# Patient Record
Sex: Male | Born: 1955 | Race: White | Hispanic: No | Marital: Married | Smoking: Never smoker
Health system: Southern US, Community
[De-identification: ages and names within clinical notes are randomized; demographics above are authoritative.]

## PROBLEM LIST (undated history)

## (undated) DIAGNOSIS — E785 Hyperlipidemia, unspecified: Secondary | ICD-10-CM

## (undated) DIAGNOSIS — T8859XA Other complications of anesthesia, initial encounter: Secondary | ICD-10-CM

## (undated) DIAGNOSIS — K760 Fatty (change of) liver, not elsewhere classified: Secondary | ICD-10-CM

## (undated) DIAGNOSIS — J45909 Unspecified asthma, uncomplicated: Secondary | ICD-10-CM

## (undated) DIAGNOSIS — I10 Essential (primary) hypertension: Secondary | ICD-10-CM

## (undated) DIAGNOSIS — G4730 Sleep apnea, unspecified: Secondary | ICD-10-CM

## (undated) DIAGNOSIS — F419 Anxiety disorder, unspecified: Secondary | ICD-10-CM

## (undated) DIAGNOSIS — K219 Gastro-esophageal reflux disease without esophagitis: Secondary | ICD-10-CM

## (undated) DIAGNOSIS — F329 Major depressive disorder, single episode, unspecified: Secondary | ICD-10-CM

## (undated) DIAGNOSIS — K2270 Barrett's esophagus without dysplasia: Secondary | ICD-10-CM

## (undated) DIAGNOSIS — T4145XA Adverse effect of unspecified anesthetic, initial encounter: Secondary | ICD-10-CM

## (undated) DIAGNOSIS — R112 Nausea with vomiting, unspecified: Secondary | ICD-10-CM

## (undated) DIAGNOSIS — F32A Depression, unspecified: Secondary | ICD-10-CM

## (undated) DIAGNOSIS — R059 Cough, unspecified: Secondary | ICD-10-CM

## (undated) DIAGNOSIS — G5600 Carpal tunnel syndrome, unspecified upper limb: Secondary | ICD-10-CM

## (undated) DIAGNOSIS — J189 Pneumonia, unspecified organism: Secondary | ICD-10-CM

## (undated) DIAGNOSIS — E119 Type 2 diabetes mellitus without complications: Secondary | ICD-10-CM

## (undated) DIAGNOSIS — N200 Calculus of kidney: Secondary | ICD-10-CM

## (undated) DIAGNOSIS — K589 Irritable bowel syndrome without diarrhea: Secondary | ICD-10-CM

## (undated) DIAGNOSIS — R05 Cough: Secondary | ICD-10-CM

## (undated) DIAGNOSIS — Z9889 Other specified postprocedural states: Secondary | ICD-10-CM

## (undated) DIAGNOSIS — J410 Simple chronic bronchitis: Secondary | ICD-10-CM

## (undated) DIAGNOSIS — M1990 Unspecified osteoarthritis, unspecified site: Secondary | ICD-10-CM

## (undated) HISTORY — DX: Carpal tunnel syndrome, unspecified upper limb: G56.00

## (undated) HISTORY — PX: CYSTOSCOPY W/ STONE MANIPULATION: SHX1427

## (undated) HISTORY — DX: Depression, unspecified: F32.A

## (undated) HISTORY — DX: Gastro-esophageal reflux disease without esophagitis: K21.9

## (undated) HISTORY — DX: Type 2 diabetes mellitus without complications: E11.9

## (undated) HISTORY — DX: Fatty (change of) liver, not elsewhere classified: K76.0

## (undated) HISTORY — PX: KNEE ARTHROSCOPY: SUR90

## (undated) HISTORY — DX: Unspecified asthma, uncomplicated: J45.909

## (undated) HISTORY — DX: Barrett's esophagus without dysplasia: K22.70

## (undated) HISTORY — PX: UPPER GASTROINTESTINAL ENDOSCOPY: SHX188

## (undated) HISTORY — PX: KNEE ARTHROSCOPY: SHX127

## (undated) HISTORY — PX: CARPAL TUNNEL RELEASE: SHX101

## (undated) HISTORY — DX: Irritable bowel syndrome, unspecified: K58.9

## (undated) HISTORY — DX: Hyperlipidemia, unspecified: E78.5

## (undated) HISTORY — PX: CHOLECYSTECTOMY: SHX55

## (undated) HISTORY — DX: Anxiety disorder, unspecified: F41.9

## (undated) HISTORY — DX: Essential (primary) hypertension: I10

---
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
## 2005-04-27 HISTORY — PX: ESOPHAGOGASTRODUODENOSCOPY: SHX1529

## 2010-05-18 HISTORY — PX: SHOULDER SURGERY: SHX246

## 2010-08-17 HISTORY — PX: JOINT REPLACEMENT: SHX530

## 2010-08-19 ENCOUNTER — Ambulatory Visit (HOSPITAL_COMMUNITY)
Admission: RE | Admit: 2010-08-19 | Discharge: 2010-08-19 | Disposition: A | Discharge status: Home / Self Care | Payer: 59 | Source: Ambulatory Visit | Attending: Orthopedic Surgery | Admitting: Orthopedic Surgery

## 2010-08-19 ENCOUNTER — Other Ambulatory Visit (HOSPITAL_COMMUNITY): Payer: Self-pay | Admitting: Orthopedic Surgery

## 2010-08-19 ENCOUNTER — Encounter (HOSPITAL_COMMUNITY)
Admission: RE | Admit: 2010-08-19 | Discharge: 2010-08-19 | Disposition: A | Discharge status: Home / Self Care | Payer: 59 | Source: Ambulatory Visit | Attending: Orthopedic Surgery | Admitting: Orthopedic Surgery

## 2010-08-19 DIAGNOSIS — IMO0002 Reserved for concepts with insufficient information to code with codable children: Secondary | ICD-10-CM | POA: Insufficient documentation

## 2010-08-19 DIAGNOSIS — M1710 Unilateral primary osteoarthritis, unspecified knee: Secondary | ICD-10-CM

## 2010-08-19 DIAGNOSIS — Z01812 Encounter for preprocedural laboratory examination: Secondary | ICD-10-CM | POA: Insufficient documentation

## 2010-08-19 DIAGNOSIS — Z01810 Encounter for preprocedural cardiovascular examination: Secondary | ICD-10-CM | POA: Insufficient documentation

## 2010-08-19 DIAGNOSIS — Z01818 Encounter for other preprocedural examination: Secondary | ICD-10-CM | POA: Insufficient documentation

## 2010-08-19 LAB — BASIC METABOLIC PANEL
CO2: 26 mEq/L (ref 19–32)
Chloride: 105 mEq/L (ref 96–112)
Creatinine, Ser: 0.97 mg/dL (ref 0.4–1.5)
GFR calc Af Amer: >60 mL/min (ref >60)
Potassium: 4.2 mEq/L (ref 3.5–5.1)
Sodium: 139 mEq/L (ref 135–145)

## 2010-08-19 LAB — CBC
HCT: 45.9 % (ref 39.0–52.0)
MCH: 29.9 pg (ref 26.0–34.0)
MCV: 85.8 fL (ref 78.0–100.0)
Platelets: 245 10*3/uL (ref 150–400)
RBC: 5.35 MIL/uL (ref 4.22–5.81)
RDW: 13.1 % (ref 11.5–15.5)

## 2010-08-19 LAB — DIFFERENTIAL
Eosinophils Absolute: 0.1 10*3/uL (ref 0.0–0.7)
Eosinophils Relative: 1 % (ref 0–5)
Lymphocytes Relative: 26 % (ref 12–46)
Lymphs Abs: 2.3 10*3/uL (ref 0.7–4.0)
Monocytes Relative: 8 % (ref 3–12)

## 2010-08-19 LAB — SURGICAL PCR SCREEN
MRSA, PCR: NEGATIVE
Staphylococcus aureus: NEGATIVE

## 2010-08-19 LAB — TYPE AND SCREEN
ABO/RH(D): A POS
Antibody Screen: NEGATIVE

## 2010-08-19 LAB — URINALYSIS, ROUTINE W REFLEX MICROSCOPIC
Glucose, UA: NEGATIVE mg/dL
Hgb urine dipstick: NEGATIVE
Protein, ur: NEGATIVE mg/dL
pH: 5.5 (ref 5.0–8.0)

## 2010-08-19 LAB — APTT: aPTT: 29 seconds (ref 24–37)

## 2010-08-19 LAB — ABO/RH: ABO/RH(D): A POS

## 2010-08-25 ENCOUNTER — Inpatient Hospital Stay (HOSPITAL_COMMUNITY)
Admission: RE | Admit: 2010-08-25 | Discharge: 2010-08-29 | DRG: 462 | Disposition: A | Discharge status: Skilled Nursing Facility | Payer: 59 | Source: Ambulatory Visit | Attending: Orthopedic Surgery | Admitting: Orthopedic Surgery

## 2010-08-25 DIAGNOSIS — Z01818 Encounter for other preprocedural examination: Secondary | ICD-10-CM

## 2010-08-25 DIAGNOSIS — Z01812 Encounter for preprocedural laboratory examination: Secondary | ICD-10-CM

## 2010-08-25 DIAGNOSIS — I951 Orthostatic hypotension: Secondary | ICD-10-CM | POA: Diagnosis not present

## 2010-08-25 DIAGNOSIS — M1710 Unilateral primary osteoarthritis, unspecified knee: Principal | ICD-10-CM | POA: Diagnosis present

## 2010-08-25 DIAGNOSIS — Z87442 Personal history of urinary calculi: Secondary | ICD-10-CM

## 2010-08-25 DIAGNOSIS — Z01810 Encounter for preprocedural cardiovascular examination: Secondary | ICD-10-CM

## 2010-08-25 DIAGNOSIS — G4733 Obstructive sleep apnea (adult) (pediatric): Secondary | ICD-10-CM | POA: Diagnosis present

## 2010-08-25 DIAGNOSIS — R110 Nausea: Secondary | ICD-10-CM | POA: Diagnosis not present

## 2010-08-26 LAB — BASIC METABOLIC PANEL
CO2: 30 mEq/L (ref 19–32)
Calcium: 8.2 mg/dL — ABNORMAL LOW (ref 8.4–10.5)
Chloride: 101 mEq/L (ref 96–112)
Creatinine, Ser: 1.23 mg/dL (ref 0.4–1.5)
Glucose, Bld: 175 mg/dL — ABNORMAL HIGH (ref 70–99)

## 2010-08-26 LAB — CBC
HCT: 33.8 % — ABNORMAL LOW (ref 39.0–52.0)
MCH: 29.4 pg (ref 26.0–34.0)
MCHC: 33.4 g/dL (ref 30.0–36.0)
MCV: 87.8 fL (ref 78.0–100.0)
Platelets: 199 10*3/uL (ref 150–400)
RDW: 13.3 % (ref 11.5–15.5)

## 2010-08-26 NOTE — Op Note (Signed)
NAME:  Shawn Fox, LATA NO.:  [id number]  MEDICAL RECORD NO.:  [id number]           PATIENT TYPE:  I  LOCATION:  5032                         FACILITY:  [facility]  PHYSICIAN:  Feliberto Gottron. Turner Daniels, M.D.   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  PROCEDURE:  Right knee arthroscopic evaluation to document bare bone arthritic changes to the trochlea and distal aspect of the medial femoral condyle.  This was followed by left total knee arthroplasty using DePuy Sigma RP components, 5 femur, 5 tibia, 41-mm patellar button, 10-mm Sigma RP bearing.  All components cemented with a double batch of DePuy HV cement with 1500 mg of Zinacef.  We then proceeded to right total knee arthroplasty using DePuy Sigma RP components, 5 femur, 4 tibia, 10-mm Sigma RP bearing and a 38-mm patellar button double batch of DePuy HV cement with 1500 mg of Zinacef.  SURGEON:  Feliberto Gottron. Turner Daniels, MD  FIRST ASSISTANT:  Shirl Harris, PA-C  ANESTHETIC:  General endotracheal.  ESTIMATED BLOOD LOSS:  400 mL on the left.  FLUID REPLACEMENT:  2 liters of crystalloid.  DRAINS PLACED:  Two medium Hemovacs on each knee and a Foley catheter.  URINE OUTPUT:  About 400 mL.  INDICATIONS FOR PROCEDURE:  A 55 year old gentleman with end-stage arthritis of the left knee bone-on-bone who was failed conservative measures, anti-inflammatory medicines, physical therapy, cortisone injections and Viscosupplementation.  On the right, he has moderate arthritic changes, but he was constrained that has bare bone arthritis and pain in his right knee was actually worse that the left now.  He desires elective arthroscopic evaluation and treatment of the right knee if possible, but it there  was bare bone, he is desire to proceed with right total knee arthroplasty.  On the left side, the plan has always been left total knee arthroplasty.  Risks and benefits of procedure and options have been discussed at length and he is prepared for surgical intervention.  DESCRIPTION OF PROCEDURE:  The patient was identified by armband and received a preoperative IV antibiotics in the holding area at [facility] followed by an epidural catheter, taken to operating room #4, appropriate anesthetic monitors were attached, general endotracheal anesthesia was induced with the patient in supine position.  Foley catheter was inserted.  Tourniquet was applied to the high thigh. Bilateral foot positioners and lateral post applied.  The patient was prepped and draped in usual sterile fashion from the ankles to the midthighs.  Time-out procedure was performed.  At this point, we began the procedure by making peripatellar portals in the right knee inferomedial and inferolateral and inserted the arthroscope into the inferolateral portal and the outflow through the inferomedial portal. Diagnostic arthroscopy revealed grade 3 chondromalacia, apex of the patella, focal grade 4 of the lateral facet and in the trochlea grade 3 to grade 4 chondromalacia as well and more importantly along the anterior distal aspect  of the medial femoral condyle, again grade 3 and focal grade 4 chondromalacia of the menisci were intact.  A little bit of meniscal tearing to the lateral meniscus, which was lightly debrided. Because there was bare bone found at this arthroscopy, we went ahead and removed the arthroscopic instruments and proceeded to left total knee arthroplasty.  Left lower extremity was then wrapped with an Esmarch bandage.  Tourniquet was inflated to 350 mmHg and anterior midline incision was made from a handbreadth above the patella to a handbreadth below the patella through the skin and subcutaneous  tissue to where 1 cm medial to and 3 cm distal to the tibial tubercle.  Small bleeders were identified and cauterized.  Transverse retinaculum was incised allowing a medial parapatellar arthrotomy.  Patella was everted.  Prepatellar fat pad resected.  Superficial medial collateral ligament elevated from anterior to posterior along the proximal aspect of the tibia.  The knee was then hyperflexed exposing the arthritic knee joint, which was down to bare bone medially with very large medial osteophytes, which were removed at this time.  The tibial spines removed with an osteotome.  A McCullough retractor was placed through the notch and the lateral Hohmann retractor.  We then entered the proximal tibia in line with the axis of the tibia with a step drill followed by the intramedullary rod and 2-degree posterior slope cutting guide.  This was set to remove 5 mm of bone medially and about 9-10 mm of bone laterally.  The cutting guide was pinned into position.  The IM rod removed and the proximal tibial cut accomplished without difficulty followed by removal of more peripheral osteophytes.  We then ended to distal femur 2 mm anterior to the PCL origin with a step drill followed by the IM rod, 5-degree left distal femoral cutting guide set for 12-mm cut because of the relatively large size of the femur.  This was pinned along the epicondylar axis and the distal femoral cut accomplished.  We then sized for a #5 femoral component using the posterior referencing sizing guide and set the cutting guide to 3 degrees of external rotation because of the varus deformity.  The chamfer cutting guide was then pinned into place.  The anterior, posterior and chamfer cuts were accomplished without difficulty.  More peripheral osteophytes were removed.  The box cutting guide was then pinned into place and the box cut was made.  The knee was then brought into full extension.  We removed the posterior half of  the menisci.  The knee was once again hyperflexed allowing [place] to remove some posterior-superior osteophytes at this time.  The patella sized to 24 mm, failed to fit a 31 button.  The cutting guide was set at 15 and the posterior 9-10 mm, the patella resected, sized to a 41 button and the lollipop was drilled at this point.  With the knee was hyperflexed, we then sized for a #5 tibial component.  The trial was pinned into place followed by the smokestack and the conical reamer followed by the Delta fin keel punch.  A 5 left femoral component was then hammered into place and the lugs drilled.  We inserted 10-mm Sigma RP bearing, brought the knee to full extension and flexed to 130 degrees without difficulty.  No thumb pressure was required on the patella, which did not subluxate laterally.  At this point, the trial components were removed, all bony surfaces water picked, cleaned dried with suction and sponges, double batch of  DePuy HV cement was then mixed and applied to all bony and metallic mating surfaces except for the posterior condyles of the femur itself.  In order, we then hammered into place a 5 tibial baseplate and removed excess cement, 5 left femoral component, removed excess cement, inserted a 10-mm Sigma RP bearing and brought the knee to full extension and then flexed to 45 degrees and removed more cement that was squeezed with the extension-compression maneuver.  The 41 button was then clamped into place and excess cement removed.  Wound irrigated out thoroughly with normal saline pulse lavage solution.  Medium Hemovac drains placed from an anterolateral approach.  After the cement had hardened, we checked our stability and range of motion one more time, they were excellent.  Parapatellar arthrotomy was closed with running #1 Vicryl suture, the subcutaneous tissue with 0 and 2-0 undyed Vicryl suture and the skin with skin staples.  A dressing of Xeroform, 4x4  dressing, sponges, Webril and an Ace wrap was then applied.  Tourniquet was let down.  The toe was noted to pink up and we proceeded to the right knee. This was likewise wrapped with an Esmarch bandage, tourniquet was inflated to 350 mmHg.  Similar incision approach was made on the right side.  After preparing the bony surfaces of femur and the tibia, the femur sized to a #5 right component, the tibia to a #4 tibial component and the Sigma RP bearing was a 10-mm bearing and the button was a 38 button.  After the trials were inserted, stability was also noted to be excellent.  The trial was removed.  The wound was irrigated out thoroughly with normal saline solution.  Bony surfaces were water picked, cleaned, dried with suction and sponges, double batch of DePuy HV cement was mixed with 1500 mg of Zinacef applied to all bony and metallic mating surfaces.  In order, we cemented into place a 4 tibia, 5 right femur, removed the excess cement to both, and inserted a 10-mm bearing and after bringing the knee to full extension with compression, flexed to 45, and removed more cement.  The 38 button was then clamped into place.  Excess cement removed.  After the cement had cured, we checked our stability one more time and again was excellent.  Medium Hemovacs were inserted from an anterolateral approach.  Parapatellar arthrotomy was closed with running #1 Vicryl suture, the subcutaneous tissue with 0 and 2-0 undyed Vicryl suture and the skin with skin staples.  Dressing of Xeroform, 4x4 dressing, sponges, Webril and an Ace wrap applied on the right side.  Tourniquet was let down.  The patient was awakened, extubated, and taken to the recovery room without difficulty.     Feliberto Gottron. Turner Daniels, M.D.     Ovid Curd  D:  08/25/2010  T:  08/26/2010  Job:  [id number]  Electronically Signed by Gean Birchwood M.D. on 08/26/2010 03:51:08 PM

## 2010-08-27 LAB — PROTIME-INR: Prothrombin Time: 20.4 seconds — ABNORMAL HIGH (ref 11.6–15.2)

## 2010-08-27 LAB — CBC
MCH: 29.2 pg (ref 26.0–34.0)
MCHC: 33.2 g/dL (ref 30.0–36.0)
MCV: 87.9 fL (ref 78.0–100.0)
Platelets: 176 10*3/uL (ref 150–400)
RBC: 3.56 MIL/uL — ABNORMAL LOW (ref 4.22–5.81)

## 2010-08-28 LAB — CBC
Hemoglobin: 9.7 g/dL — ABNORMAL LOW (ref 13.0–17.0)
MCH: 29.4 pg (ref 26.0–34.0)
MCHC: 34 g/dL (ref 30.0–36.0)
MCV: 86.4 fL (ref 78.0–100.0)
RBC: 3.3 MIL/uL — ABNORMAL LOW (ref 4.22–5.81)

## 2010-08-28 LAB — PROTIME-INR: Prothrombin Time: 26.9 seconds — ABNORMAL HIGH (ref 11.6–15.2)

## 2010-08-29 LAB — PROTIME-INR: INR: 1.87 — ABNORMAL HIGH (ref 0.00–1.49)

## 2010-09-05 NOTE — Discharge Summary (Signed)
NAME:  Shawn Fox, Shawn Fox NO.:  [id number]  MEDICAL RECORD NO.:  [id number]           PATIENT TYPE:  I  LOCATION:  5032                         FACILITY:  [facility]  PHYSICIAN:  Feliberto Gottron. Turner Daniels, M.D.   DATE OF BIRTH:  09-28-1955  DATE OF ADMISSION:  08/25/2010 DATE OF DISCHARGE:  08/28/2010                              DISCHARGE SUMMARY   CHIEF COMPLAINT:  Bilateral knee pain.  HISTORY OF PRESENT ILLNESS:  This is a 55 year old gentleman who complains of severe unremitting pain in both of his knees despite extensive conservative treatment with antiinflammatories, pain medication, and steroid injections in addition to arthroscopic decompression.  He now desires a surgical intervention.  All risks and benefits of surgery were discussed with the patient.  PAST MEDICAL HISTORY:  Significant for sleep apnea, kidney stones, and arthritis.  PAST SURGICAL HISTORY:  Significant for knee arthroscopy.  ALLERGIES:  He has an allergy to DEMEROL.  FAMILY HISTORY:  Noncontributory.  SOCIAL HISTORY:  He denies use of tobacco or alcohol.  PHYSICAL EXAMINATION:  Gross examination of the knee demonstrates range of motion to be 5-120 degrees.  He has a varus deformity on the left side.  He is tender to palpation along the medial joint lines was palpable patellofemoral crepitus.  X-rays of the knee demonstrates end- stage arthritis in the medial compartment of the left knee.  The right knee had moderate-to-severe arthritis in the medial compartment. Arthroscopic evaluation of the right knee demonstrates more extensive arthritic changes in the patellofemoral compartment.  PREOP LABS:  White blood cells 8.7, red blood cells 5.35, hemoglobin 16, hematocrit 45.9, platelets 245.  PT 11.9, INR 0.86, PTT 29.  Sodium 139, potassium 4.2, chloride 105, glucose 97, BUN 12.  Urinalysis was within normal limits.  HOSPITAL COURSE:  Mr. Dusza was admitted to [facility] on August 25, 2010 when he  underwent bilateral total knee arthroplasties.  The procedures were performed by Dr. Gean Birchwood and the patient tolerated them well. Hemovac drains were placed into both the right and left knee.  A perioperative Foley catheter was also placed.  He was transferred to the floor on Lovenox and Coumadin for DVT prophylaxis.  He had epidural anesthesia for pain control.  The following morning, he reported that he was doing well.  He complained of nausea, but no vomiting.  His Foley catheter was removed after physical therapy.  Both of his surgical drains were removed by Dr. Turner Daniels.  On the second postoperative day, he continued to report some mild nausea, but stated that his pain was fairly well controlled.  Hemoglobin was 10.4.  Surgical dressings were changed and both incisions were found to be benign.  He made slow progress with physical therapy.  On the third postoperative day, he was awake and alert and passing stool and flatus.  His hemoglobin was 9.7 and his surgical dressing remained clean.  He was discharged to a skilled nursing facility for rehab.  DISPOSITION:  The patient was discharged to skilled nursing on August 28, 2010.  He is weightbearing as tolerated.  He would be maintained on Coumadin with a target  INR of 1.5 to 2.0.  This will be managed by the nursing facility. He would also be managing his physical therapy.  He will return to the clinic in approximately 10 days for x-rays and staple removal.  FINAL DIAGNOSIS:  End-stage arthritis of both knees.     Shirl Harris, PA   ______________________________ Feliberto Gottron. Turner Daniels, M.D.    JW/MEDQ  D:  08/28/2010  T:  08/28/2010  Job:  [id number]  Electronically Signed by Shirl Harris PA on 09/04/2010 05:21:10 PM Electronically Signed by Gean Birchwood M.D. on 09/05/2010 09:53:55 AM

## 2010-09-05 NOTE — Discharge Summary (Signed)
  NAME:  BRAYDYN, SCHULTES NO.:  [id number]  MEDICAL RECORD NO.:  [id number]           PATIENT TYPE:  I  LOCATION:  5032                         FACILITY:  [facility]  PHYSICIAN:  Feliberto Gottron. Turner Daniels, M.D.   DATE OF BIRTH:  09/28/1955  DATE OF ADMISSION:  08/25/2010 DATE OF DISCHARGE:  08/29/2010                              DISCHARGE SUMMARY   ADDENDUM  Mr. Kloth developed some orthostatic hypotension with physial therapy yesterday when he was kept an additional night for observation.  On August 29, 2010, he is reporting significant improvement and felt ready for rehab, so he was discharge for [facility] for short stay in the skilled nursing facility.     Shirl Harris, PA   ______________________________ Feliberto Gottron. Turner Daniels, M.D.    JW/MEDQ  D:  08/29/2010  T:  08/29/2010  Job:  [id number]  Electronically Signed by Shirl Harris PA on 09/04/2010 05:21:49 PM Electronically Signed by Gean Birchwood M.D. on 09/05/2010 09:53:57 AM

## 2011-04-23 ENCOUNTER — Other Ambulatory Visit: Payer: Self-pay | Admitting: Orthopedic Surgery

## 2011-04-23 NOTE — H&P (Signed)
  HISTORY OF PRESENT ILLNESS:  Shawn Fox has been seen previously for left shoulder impingement and AC joint arthritis.  He has had a couple cortisone injections that have each provided him with temporary pain relief.  At this point the left shoulder wakes him from sleep most nights, and he is interested in discussing a surgical intervention.  He also has a new complaint of pain and a nodule at the base of his right 4th finger.  He reports that it has been present for about a year or 2, but it has recently gotten larger, and any direct pressure causes fairly significant pain. He is right-hand dominant.  ROS: Patient denies dizziness, nausea, fever, chills, vomiting, shortness of breath, chest pain, loss of appetite, or rash.    OBJECTIVE:  Left Shoulder:  Demonstrates 1+ positive impingement in the Neer position and 2+ positive impingement in the Hawkins position.  He has pain with cross-arm adduction testing and is tender to palpation at the [facility] joint.  Isolated testing of the rotator cuff demonstrates strength to be 5/5 throughout.  Examination of the right hand demonstrates a palpable nodule at the 4th MCP joint that is tender to palpation.  RADIOGRAPHS:  X-rays were ordered, performed, and interpreted by me today.  Two views of the right ring finger demonstrate no obvious fracture or degenerative changes.  IMPRESSION:   1.    Status post bilateral total knee arthroplasties dated 08/25/2010. 2.    Right 4th finger A2 ganglion cyst. 3.    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  PLAN: In regard to the left shoulder and the hand, we will get him set up for a left shoulder decompression and excision of a right hand ganglion cyst.

## 2011-04-24 ENCOUNTER — Encounter (HOSPITAL_BASED_OUTPATIENT_CLINIC_OR_DEPARTMENT_OTHER): Payer: Self-pay | Admitting: *Deleted

## 2011-04-24 NOTE — Progress Notes (Signed)
Since pt used to have sleep apnea-lost wt-does not have-recommended bringing overnight bag-plan to go home-brother to stay with him post op-depends on how his resp status is. Did well post bilat total knees 4/12

## 2011-04-26 ENCOUNTER — Encounter (HOSPITAL_BASED_OUTPATIENT_CLINIC_OR_DEPARTMENT_OTHER): Payer: Self-pay | Admitting: Orthopedic Surgery

## 2011-04-26 DIAGNOSIS — M7542 Impingement syndrome of left shoulder: Secondary | ICD-10-CM | POA: Diagnosis present

## 2011-04-26 DIAGNOSIS — M67441 Ganglion, right hand: Secondary | ICD-10-CM

## 2011-04-27 ENCOUNTER — Encounter (HOSPITAL_BASED_OUTPATIENT_CLINIC_OR_DEPARTMENT_OTHER)
Admission: RE | Disposition: A | Discharge status: Home / Self Care | Payer: Self-pay | Source: Ambulatory Visit | Attending: Orthopedic Surgery

## 2011-04-27 ENCOUNTER — Encounter (HOSPITAL_BASED_OUTPATIENT_CLINIC_OR_DEPARTMENT_OTHER): Payer: Self-pay | Admitting: *Deleted

## 2011-04-27 ENCOUNTER — Encounter (HOSPITAL_BASED_OUTPATIENT_CLINIC_OR_DEPARTMENT_OTHER): Payer: Self-pay | Admitting: Anesthesiology

## 2011-04-27 ENCOUNTER — Ambulatory Visit (HOSPITAL_BASED_OUTPATIENT_CLINIC_OR_DEPARTMENT_OTHER): Payer: 59 | Admitting: Anesthesiology

## 2011-04-27 ENCOUNTER — Ambulatory Visit (HOSPITAL_BASED_OUTPATIENT_CLINIC_OR_DEPARTMENT_OTHER)
Admission: RE | Admit: 2011-04-27 | Discharge: 2011-04-27 | Disposition: A | Discharge status: Home / Self Care | Payer: 59 | Source: Ambulatory Visit | Attending: Orthopedic Surgery | Admitting: Orthopedic Surgery

## 2011-04-27 DIAGNOSIS — M6740 Ganglion, unspecified site: Secondary | ICD-10-CM | POA: Insufficient documentation

## 2011-04-27 DIAGNOSIS — M67441 Ganglion, right hand: Secondary | ICD-10-CM

## 2011-04-27 DIAGNOSIS — M7542 Impingement syndrome of left shoulder: Secondary | ICD-10-CM | POA: Diagnosis present

## 2011-04-27 DIAGNOSIS — G4730 Sleep apnea, unspecified: Secondary | ICD-10-CM | POA: Insufficient documentation

## 2011-04-27 DIAGNOSIS — M25819 Other specified joint disorders, unspecified shoulder: Secondary | ICD-10-CM | POA: Insufficient documentation

## 2011-04-27 DIAGNOSIS — M19019 Primary osteoarthritis, unspecified shoulder: Secondary | ICD-10-CM | POA: Insufficient documentation

## 2011-04-27 HISTORY — DX: Pneumonia, unspecified organism: J18.9

## 2011-04-27 HISTORY — DX: Calculus of kidney: N20.0

## 2011-04-27 HISTORY — DX: Simple chronic bronchitis: J41.0

## 2011-04-27 HISTORY — DX: Adverse effect of unspecified anesthetic, initial encounter: T41.45XA

## 2011-04-27 HISTORY — DX: Unspecified osteoarthritis, unspecified site: M19.90

## 2011-04-27 HISTORY — PX: MASS EXCISION: SHX2000

## 2011-04-27 HISTORY — DX: Sleep apnea, unspecified: G47.30

## 2011-04-27 HISTORY — DX: Cough, unspecified: R05.9

## 2011-04-27 HISTORY — DX: Other specified postprocedural states: Z98.890

## 2011-04-27 HISTORY — DX: Cough: R05

## 2011-04-27 HISTORY — DX: Nausea with vomiting, unspecified: R11.2

## 2011-04-27 HISTORY — DX: Other complications of anesthesia, initial encounter: T88.59XA

## 2011-04-27 SURGERY — SHOULDER ARTHROSCOPY WITH DISTAL CLAVICLE RESECTION
Anesthesia: Choice | Site: Shoulder | Laterality: Right | Wound class: Clean

## 2011-04-27 MED ORDER — CEFAZOLIN SODIUM 1-5 GM-% IV SOLN: 1.0000 g | INTRAVENOUS | Status: DC

## 2011-04-27 MED ORDER — FENTANYL CITRATE 0.05 MG/ML IJ SOLN
100.0000 ug | Freq: Once | INTRAMUSCULAR | Status: AC
  Administered 2011-04-27: 100 ug via INTRAVENOUS

## 2011-04-27 MED ORDER — DROPERIDOL 2.5 MG/ML IJ SOLN
INTRAMUSCULAR | Status: DC | PRN
  Administered 2011-04-27: 0.625 mg via INTRAVENOUS

## 2011-04-27 MED ORDER — OXYCODONE-ACETAMINOPHEN 5-325 MG PO TABS: 1.0000 | ORAL_TABLET | ORAL | Status: AC | PRN

## 2011-04-27 MED ORDER — NEOSTIGMINE METHYLSULFATE 1 MG/ML IJ SOLN
INTRAMUSCULAR | Status: DC | PRN
  Administered 2011-04-27: 3 mg via INTRAVENOUS

## 2011-04-27 MED ORDER — CHLORHEXIDINE GLUCONATE 4 % EX LIQD: 60.0000 mL | Freq: Once | CUTANEOUS | Status: DC

## 2011-04-27 MED ORDER — BUPIVACAINE HCL (PF) 0.5 % IJ SOLN
INTRAMUSCULAR | Status: DC | PRN
  Administered 2011-04-27: 4 mL

## 2011-04-27 MED ORDER — LIDOCAINE HCL (CARDIAC) 20 MG/ML IV SOLN
INTRAVENOUS | Status: DC | PRN
  Administered 2011-04-27: 50 mg via INTRAVENOUS

## 2011-04-27 MED ORDER — DEXTROSE-NACL 5-0.45 % IV SOLN: INTRAVENOUS | Status: DC

## 2011-04-27 MED ORDER — OXYCODONE-ACETAMINOPHEN 5-325 MG PO TABS: 1.0000 | ORAL_TABLET | Freq: Once | ORAL | Status: DC

## 2011-04-27 MED ORDER — FENTANYL CITRATE 0.05 MG/ML IJ SOLN
INTRAMUSCULAR | Status: DC | PRN
  Administered 2011-04-27: 50 ug via INTRAVENOUS

## 2011-04-27 MED ORDER — GLYCOPYRROLATE 0.2 MG/ML IJ SOLN
INTRAMUSCULAR | Status: DC | PRN
  Administered 2011-04-27: .4 mg via INTRAVENOUS

## 2011-04-27 MED ORDER — DEXAMETHASONE SODIUM PHOSPHATE 4 MG/ML IJ SOLN
INTRAMUSCULAR | Status: DC | PRN
  Administered 2011-04-27: 10 mg via INTRAVENOUS

## 2011-04-27 MED ORDER — PROPOFOL 10 MG/ML IV EMUL
INTRAVENOUS | Status: DC | PRN
  Administered 2011-04-27: 200 mg via INTRAVENOUS

## 2011-04-27 MED ORDER — METOCLOPRAMIDE HCL 5 MG/ML IJ SOLN: 10.0000 mg | Freq: Once | INTRAMUSCULAR | Status: DC | PRN

## 2011-04-27 MED ORDER — OXYCODONE-ACETAMINOPHEN 5-325 MG PO TABS
1.0000 | ORAL_TABLET | ORAL | Status: DC | PRN
  Administered 2011-04-27: 1 via ORAL

## 2011-04-27 MED ORDER — LIDOCAINE HCL (PF) 2 % IJ SOLN
INTRAMUSCULAR | Status: DC | PRN
  Administered 2011-04-27: 2 mL

## 2011-04-27 MED ORDER — ONDANSETRON HCL 4 MG/2ML IJ SOLN
INTRAMUSCULAR | Status: DC | PRN
  Administered 2011-04-27: 4 mg via INTRAVENOUS

## 2011-04-27 MED ORDER — CEFAZOLIN SODIUM-DEXTROSE 2-3 GM-% IV SOLR
2.0000 g | INTRAVENOUS | Status: AC
  Administered 2011-04-27: 2 g via INTRAVENOUS

## 2011-04-27 MED ORDER — LACTATED RINGERS IV SOLN
Freq: Once | INTRAVENOUS | Status: AC
  Administered 2011-04-27 (×2): via INTRAVENOUS

## 2011-04-27 MED ORDER — ROPIVACAINE HCL 5 MG/ML IJ SOLN
INTRAMUSCULAR | Status: DC | PRN
  Administered 2011-04-27: 18 mL via EPIDURAL

## 2011-04-27 MED ORDER — EPINEPHRINE HCL 1 MG/ML IJ SOLN
INTRAMUSCULAR | Status: DC | PRN
  Administered 2011-04-27: 4 mg

## 2011-04-27 MED ORDER — SODIUM CHLORIDE 0.9 % IR SOLN
Status: DC | PRN
  Administered 2011-04-27: 4000 mL

## 2011-04-27 MED ORDER — MORPHINE SULFATE 2 MG/ML IJ SOLN: 0.0500 mg/kg | INTRAMUSCULAR | Status: DC | PRN

## 2011-04-27 MED ORDER — MIDAZOLAM HCL 2 MG/2ML IJ SOLN
0.5000 mg | INTRAMUSCULAR | Status: DC | PRN
  Administered 2011-04-27 (×2): 2 mg via INTRAVENOUS

## 2011-04-27 MED ORDER — LACTATED RINGERS IV SOLN
INTRAVENOUS | Status: DC
Start: 2011-04-27 — End: 2011-04-27

## 2011-04-27 SURGICAL SUPPLY — 81 items
BANDAGE GAUZE STRT 1 STR LF (GAUZE/BANDAGES/DRESSINGS) ×3 IMPLANT
BLADE AVERAGE 25X9 (BLADE) IMPLANT
BLADE CUTTER GATOR 3.5 (BLADE) IMPLANT
BLADE GREAT WHITE 4.2 (BLADE) ×3 IMPLANT
BLADE SURG 15 STRL LF DISP TIS (BLADE) ×2 IMPLANT
BLADE SURG 15 STRL SS (BLADE) ×3
BNDG CMPR 9X4 STRL LF SNTH (GAUZE/BANDAGES/DRESSINGS)
BNDG CMPR MD 5X2 ELC HKLP STRL (GAUZE/BANDAGES/DRESSINGS) ×2
BNDG COHESIVE 1X5 TAN STRL LF (GAUZE/BANDAGES/DRESSINGS) ×3 IMPLANT
BNDG ELASTIC 2 VLCR STRL LF (GAUZE/BANDAGES/DRESSINGS) ×1 IMPLANT
BNDG ESMARK 4X9 LF (GAUZE/BANDAGES/DRESSINGS) IMPLANT
BUR EGG 3PK/BX (BURR) IMPLANT
BUR VERTEX HOODED 4.5 (BURR) ×3 IMPLANT
CANISTER OMNI JUG 16 LITER (MISCELLANEOUS) ×3 IMPLANT
CANISTER SUCTION 2500CC (MISCELLANEOUS) IMPLANT
CANNULA 5.75X71 LONG (CANNULA) IMPLANT
CANNULA TWIST IN 8.25X7CM (CANNULA) IMPLANT
CHLORAPREP W/TINT 26ML (MISCELLANEOUS) ×4 IMPLANT
CLOTH BEACON ORANGE TIMEOUT ST (SAFETY) ×3 IMPLANT
CORDS BIPOLAR (ELECTRODE) ×3 IMPLANT
COVER MAYO STAND STRL (DRAPES) ×3 IMPLANT
COVER TABLE BACK 60X90 (DRAPES) ×3 IMPLANT
CUFF TOURNIQUET SINGLE 18IN (TOURNIQUET CUFF) ×1 IMPLANT
DECANTER SPIKE VIAL GLASS SM (MISCELLANEOUS) IMPLANT
DRAPE EXTREMITY T 121X128X90 (DRAPE) ×3 IMPLANT
DRAPE INCISE IOBAN 66X45 STRL (DRAPES) IMPLANT
DRAPE STERI 35X30 U-POUCH (DRAPES) ×3 IMPLANT
DRAPE U-SHAPE 76X120 STRL (DRAPES) ×6 IMPLANT
ELECT REM PT RETURN 9FT ADLT (ELECTROSURGICAL) ×3
ELECTRODE REM PT RTRN 9FT ADLT (ELECTROSURGICAL) ×2 IMPLANT
GAUZE XEROFORM 1X8 LF (GAUZE/BANDAGES/DRESSINGS) ×3 IMPLANT
GLOVE BIO SURGEON STRL SZ7 (GLOVE) ×4 IMPLANT
GLOVE BIO SURGEON STRL SZ7.5 (GLOVE) ×6 IMPLANT
GLOVE BIOGEL PI IND STRL 7.0 (GLOVE) ×2 IMPLANT
GLOVE BIOGEL PI IND STRL 8 (GLOVE) ×2 IMPLANT
GLOVE BIOGEL PI INDICATOR 7.0 (GLOVE) ×2
GLOVE BIOGEL PI INDICATOR 8 (GLOVE) ×2
GLOVE SKINSENSE NS SZ7.0 (GLOVE) ×1
GLOVE SKINSENSE STRL SZ7.0 (GLOVE) IMPLANT
GOWN PREVENTION PLUS XLARGE (GOWN DISPOSABLE) ×7 IMPLANT
NDL SAFETY ECLIPSE 18X1.5 (NEEDLE) ×2 IMPLANT
NDL SUT 6 .5 CRC .975X.05 MAYO (NEEDLE) IMPLANT
NEEDLE HYPO 18GX1.5 SHARP (NEEDLE) ×3
NEEDLE HYPO 22GX1.5 SAFETY (NEEDLE) IMPLANT
NEEDLE MAYO TAPER (NEEDLE)
NS IRRIG 1000ML POUR BTL (IV SOLUTION) ×1 IMPLANT
PACK ARTHROSCOPY DSU (CUSTOM PROCEDURE TRAY) ×3 IMPLANT
PACK BASIN DAY SURGERY FS (CUSTOM PROCEDURE TRAY) ×3 IMPLANT
PADDING CAST ABS 4INX4YD NS (CAST SUPPLIES) ×1
PADDING CAST ABS COTTON 4X4 ST (CAST SUPPLIES) ×2 IMPLANT
PASSER SUT SWANSON 36MM LOOP (INSTRUMENTS) IMPLANT
PENCIL BUTTON HOLSTER BLD 10FT (ELECTRODE) IMPLANT
SET IRRIG Y TYPE TUR BLADDER L (SET/KITS/TRAYS/PACK) ×3 IMPLANT
SLEEVE SCD COMPRESS KNEE MED (MISCELLANEOUS) ×1 IMPLANT
SLING ARM FOAM STRAP LRG (SOFTGOODS) IMPLANT
SLING ARM FOAM STRAP MED (SOFTGOODS) IMPLANT
SLING ARM IMMOBILIZER LRG (SOFTGOODS) IMPLANT
SPONGE GAUZE 2X2 8PLY STRL LF (GAUZE/BANDAGES/DRESSINGS) ×6 IMPLANT
SPONGE GAUZE 4X4 12PLY (GAUZE/BANDAGES/DRESSINGS) ×3 IMPLANT
SPONGE LAP 4X18 X RAY DECT (DISPOSABLE) IMPLANT
SUCTION FRAZIER TIP 10 FR DISP (SUCTIONS) IMPLANT
SUT ETHIBOND 2 OS 4 DA (SUTURE) IMPLANT
SUT ETHILON 4 0 PS 2 18 (SUTURE) IMPLANT
SUT ETHILON 5 0 P 3 18 (SUTURE) ×1
SUT MNCRL AB 4-0 PS2 18 (SUTURE) IMPLANT
SUT MON AB 4-0 PC3 18 (SUTURE) IMPLANT
SUT NYLON ETHILON 5-0 P-3 1X18 (SUTURE) IMPLANT
SUT VIC AB 2-0 SH 27 (SUTURE)
SUT VIC AB 2-0 SH 27XBRD (SUTURE) IMPLANT
SUT VIC AB 3-0 PS1 18 (SUTURE)
SUT VIC AB 3-0 PS1 18XBRD (SUTURE) IMPLANT
SYR 5ML LL (SYRINGE) ×3 IMPLANT
SYR BULB 3OZ (MISCELLANEOUS) ×3 IMPLANT
SYR CONTROL 10ML LL (SYRINGE) IMPLANT
SYR TB 1ML LL NO SAFETY (SYRINGE) IMPLANT
TAPE PAPER 3X10 WHT MICROPORE (GAUZE/BANDAGES/DRESSINGS) ×3 IMPLANT
TOWEL OR 17X24 6PK STRL BLUE (TOWEL DISPOSABLE) ×4 IMPLANT
TUBE CONNECTING 20X1/4 (TUBING) IMPLANT
UNDERPAD 30X30 INCONTINENT (UNDERPADS AND DIAPERS) ×3 IMPLANT
WAND STAR VAC 90 (SURGICAL WAND) ×3 IMPLANT
WATER STERILE IRR 1000ML POUR (IV SOLUTION) ×3 IMPLANT

## 2011-04-27 NOTE — Transfer of Care (Signed)
Immediate Anesthesia Transfer of Care Note  Patient: Shawn Fox  Procedure(s) Performed:  SHOULDER ARTHROSCOPY WITH DISTAL CLAVICLE RESECTION - LT SHOULDER ARTHROSCOPY AND ACROMIOPLASTY AND DCE; EXCISION MASS - RT 4TH FINGER GANGLION CYST EXCISION  Patient Location: PACU  Anesthesia Type: General and Regional  Level of Consciousness: awake  Airway & Oxygen Therapy: Patient Spontanous Breathing and Patient connected to face mask oxygen  Post-op Assessment: Report given to PACU RN and Post -op Vital signs reviewed and stable  Post vital signs: Reviewed and stable  Complications: No apparent anesthesia complications

## 2011-04-27 NOTE — Anesthesia Procedure Notes (Addendum)
Anesthesia Regional Block:  Interscalene brachial plexus block  Pre-Anesthetic Checklist: ,, timeout performed, Correct Patient, Correct Site, Correct Laterality, Correct Procedure, Correct Position, site marked, Risks and benefits discussed,  Surgical consent,  Pre-op evaluation,  At surgeon's request and post-op pain management  Laterality: Left  Prep: chloraprep       Needles:   Needle Type: Other   (Arrow Echogenic)   Needle Length: 9cm  Needle Gauge: 21    Additional Needles:  Procedures: ultrasound guided Interscalene brachial plexus block Narrative:  Injection made incrementally with aspirations every 5 mL.  Performed by: Personally  Anesthesiologist: C Ameri Cahoon  Additional Notes: Ultrasound guidance used to: id relevant anatomy, confirm needle position, local anesthetic spread, avoidance of vascular puncture. Picture saved. No complications. Block performed personally by Janetta Hora. Ogechi Kuehnel, MD    Interscalene brachial plexus block Time 12:26 - 12:32

## 2011-04-27 NOTE — Brief Op Note (Signed)
04/27/2011  2:34 PM  PATIENT:  Shawn Fox  55 y.o. male  PRE-OPERATIVE DIAGNOSIS:  LT SHOULDER IMPINGEMENT,AC JOINT OA,RT 4TH FINGER A2 GANGLION   POST-OPERATIVE DIAGNOSIS:  LT SHOULDER IMPINGEMENT,AC JOINT OA,RT 4TH FINGER A2 GANGLION   PROCEDURE:  Procedure(s): SHOULDER ARTHROSCOPY WITH DISTAL CLAVICLE RESECTION EXCISION MASS  SURGEON:  Surgeon(s): Archie Endo, MD  PHYSICIAN ASSISTANT: Mauricia [facility]  ANESTHESIA:   regional and general  EBL:  Total I/O In: 1300 [I.V.:1300] Out: -   BLOOD ADMINISTERED:none  DRAINS: none   LOCAL MEDICATIONS USED:  MARCAINE 5CC  SPECIMEN:  No Specimen  DISPOSITION OF SPECIMEN:  N/A  COUNTS:  YES  TOURNIQUET:   Total Tourniquet Time Documented: Forearm (Right) - 9 minutes  DICTATION: .Other Dictation: Dictation Number [id number]  PLAN OF CARE: Discharge to home after PACU  PATIENT DISPOSITION:  PACU - hemodynamically stable.   Delay start of Pharmacological VTE agent (>24hrs) due to surgical blood loss or risk of bleeding:  {YES/NO/NOT APPLICABLE:20182

## 2011-04-27 NOTE — Interval H&P Note (Signed)
History and Physical Interval Note:  04/27/2011 12:13 PM  Shawn Fox  has presented today for surgery, with the diagnosis of LT SHOULDER IMPINGEMENT,AC JOINT OA,RT 4TH FINGER A2 GANGLION   The various methods of treatment have been discussed with the patient and family. After consideration of risks, benefits and other options for treatment, the patient has consented to  Procedure(s): SHOULDER ARTHROSCOPY WITH DISTAL CLAVICLE RESECTION EXCISION MASS as a surgical intervention .  The patients' history has been reviewed, patient examined, no change in status, stable for surgery.  I have reviewed the patients' chart and labs.  Questions were answered to the patient's satisfaction.     Nestor Lewandowsky

## 2011-04-27 NOTE — Progress Notes (Signed)
2Assisted Dr. Gelene Mink with right, ultrasound guided, interscalene  block. Side rails up, monitors on throughout procedure. See vital signs in flow sheet. Tolerated Procedure well.

## 2011-04-27 NOTE — Op Note (Signed)
Preop diagnosis: left shoulder impingement, a.c. joint arthritis. Right fourth finger A-1 ganglion cyst.  Postoperative diagnosis: Same.  Procedure: Left shoulder arthroscopic anterior-inferior acromioplasty, distal clavicle excision, debridement degenerative tear of the labrum. Right fourth finger A-1 ganglion cyst excision. A Surgeon Gean Birchwood  Asst. Shirl Harris PA-C  Estimated blood loss minimal.  Fluid replacement: 1300 cc of crystalloid.  Complications: None  Indications for procedure: 55 year-old male with left shoulder impingement syndrome, a.c. joint arthritis, and right fourth finger A-1 ganglion cyst for the last 3 months. He has failed conservative measures with physical therapy, anti-inflammatory medicines, but did get good temporary relief from a subacromial injection of cortisone. His pain wakes him up at night interferes with activities of daily living and interferes with his ability to do chores around the house. After carefully weighing his options he desires elective arthroscopic decompression of the left shoulder with distal clavicle excision and debridement of any torn labrum or rotator cuff tears which may be repaired. He also liked the right fourth finger A-1 ganglion excised as it interferes with his gripping.  Description of procedure: Patient was identified by arm band receive preoperative IV antibiotics in the holding area [facility]. He then underwent left shoulder interscalene block anesthetic and was taken to operating room 5 where the appropriate anesthetic monitors were attached and general endotracheal anesthesia was induced with the patient in the supine position. He was then placed in the beachchair position and the left upper terminate prepped and draped in usual sterile fashion from the wrist to the hemithorax. Standard time out procedure was performed. We began the operation by making standard portals with a #11 blade 1.5 cm anterior to the  a.c. joint, lateral to the junction of middle and posterior thirds of the acromion, and posterior the posterior lateral corner of the acromion process. The arthroscope was introduced through the posterior lateral portal. The inflow through the anterior portal. NA 4.2 gray-white sucker shaver through the posterior portal. We then accomplished a subacromial bursectomy outlining a subacromial spur ureteric a.c. joint and documenting an intact rotator cuff. Using a 4.5 noted voretex bur we then removed with a subacromial spur in the distal anterior clavicle. We then switched portals bringing the inflow and laterally the scope posteriorly and the bur anteriorly completing the distal clavicle excision photographic documentation was made of this. The arthroscope was then repositioned into the glenohumeral joint using the posterior portal. We visualized degenerative tearing of the labrum and this was excised using a 3.5 mm Gator sucker shaver. We documented an intact biceps anchor subscapularis supraspinatus and infraspinatus insertions with 20% fraying of the supraspinatus insertion lightly debrided. The articular cartilage of the glenohumeral joint was pristine. The shoulder was then irrigated out normal saline solution the arthroscopic incisions removed and a dressing of Xeroform 4 x 4 dressing sponges paper tape and a sling was applied. We then direct her attention to the right hand which was prepped and draped in usual sterile fashion from the fingertips to her wrist tourniquet the hand was wrapped with an Esmarch bandage the tourniquet inflated to 250 mmHg. A 1 cm transverse incision was made over the MCP joint of the right fourth finger just into the subcutaneous tissue which was then spread with tenotomy scissors exposing the A-1 ganglion. Using Ragnell retractors we isolated the ganglion and excised with a #15 blade including a small piece of the tendon sheath. We then documented smooth flowing of the tendon. This  wound was irrigated out  normal saline solution and closed with 4-0 nylon suture after letting the tourniquet down to make sure there were no significant bleeders. A dressing of Xeroform 2 x 2 dressing sponges 2 inch webril and1 inch Coban was then placed. At this point the patient was awakened extubated and taken to the recovery without difficulty.

## 2011-04-27 NOTE — Anesthesia Preprocedure Evaluation (Signed)
Anesthesia Evaluation  Patient identified by MRN, date of birth, ID band Patient awake    Reviewed: Allergy & Precautions, H&P , NPO status , Patient's Chart, lab work & pertinent test results, reviewed documented beta blocker date and time   Airway Mallampati: II TM Distance: >3 FB Neck ROM: full    Dental   Pulmonary sleep apnea , pneumonia ,          Cardiovascular neg cardio ROS     Neuro/Psych Negative Neurological ROS  Negative Psych ROS   GI/Hepatic negative GI ROS, Neg liver ROS,   Endo/Other  Negative Endocrine ROS  Renal/GU negative Renal ROS  Genitourinary negative   Musculoskeletal   Abdominal   Peds  Hematology negative hematology ROS (+)   Anesthesia Other Findings See surgeon's H&P   Reproductive/Obstetrics negative OB ROS                           Anesthesia Physical Anesthesia Plan  ASA: III  Anesthesia Plan: General and General ETT   Post-op Pain Management: MAC Combined w/ Regional for Post-op pain   Induction: Intravenous  Airway Management Planned: Oral ETT  Additional Equipment:   Intra-op Plan:   Post-operative Plan: Extubation in OR  Informed Consent: I have reviewed the patients History and Physical, chart, labs and discussed the procedure including the risks, benefits and alternatives for the proposed anesthesia with the patient or authorized representative who has indicated his/her understanding and acceptance.     Plan Discussed with: CRNA and Surgeon  Anesthesia Plan Comments:         Anesthesia Quick Evaluation

## 2011-04-27 NOTE — Anesthesia Postprocedure Evaluation (Signed)
  Anesthesia Post-op Note  Patient: Shawn Fox  Procedure(s) Performed:  SHOULDER ARTHROSCOPY WITH DISTAL CLAVICLE RESECTION - LT SHOULDER ARTHROSCOPY AND ACROMIOPLASTY AND DCE; EXCISION MASS - RT 4TH FINGER GANGLION CYST EXCISION  Patient Location: PACU  Anesthesia Type: GA combined with regional for post-op pain  Level of Consciousness: awake, alert  and oriented  Airway and Oxygen Therapy: Patient Spontanous Breathing  Post-op Pain: mild  Post-op Assessment: Post-op Vital signs reviewed, Patient's Cardiovascular Status Stable, Respiratory Function Stable, Patent Airway, No signs of Nausea or vomiting, Adequate PO intake and Pain level controlled  Post-op Vital Signs: Reviewed and stable  Complications: No apparent anesthesia complications

## 2011-04-30 ENCOUNTER — Encounter (HOSPITAL_BASED_OUTPATIENT_CLINIC_OR_DEPARTMENT_OTHER): Payer: Self-pay | Admitting: Orthopedic Surgery

## 2016-05-13 HISTORY — PX: COLONOSCOPY: SHX174

## 2017-07-22 ENCOUNTER — Encounter: Payer: Self-pay | Admitting: Gastroenterology

## 2017-09-02 ENCOUNTER — Ambulatory Visit: Payer: Self-pay | Admitting: Gastroenterology

## 2018-04-12 ENCOUNTER — Encounter: Payer: Self-pay | Admitting: Gastroenterology

## 2018-04-12 ENCOUNTER — Ambulatory Visit: Payer: BLUE CROSS/BLUE SHIELD | Admitting: Gastroenterology

## 2018-04-12 VITALS — BP 144/90 | HR 80 | Ht 69.0 in | Wt 259.2 lb

## 2018-04-12 DIAGNOSIS — R05 Cough: Secondary | ICD-10-CM

## 2018-04-12 DIAGNOSIS — R059 Cough, unspecified: Secondary | ICD-10-CM

## 2018-04-12 DIAGNOSIS — K219 Gastro-esophageal reflux disease without esophagitis: Secondary | ICD-10-CM | POA: Diagnosis not present

## 2018-04-12 MED ORDER — FAMOTIDINE 20 MG PO TABS: 20.0000 mg | ORAL_TABLET | Freq: Every day | ORAL | 6 refills | Status: DC

## 2018-04-12 MED ORDER — PANTOPRAZOLE SODIUM 40 MG PO TBEC: 40.0000 mg | DELAYED_RELEASE_TABLET | Freq: Every day | ORAL | 6 refills | Status: DC

## 2018-04-12 NOTE — Progress Notes (Signed)
Chief Complaint: GERD  Referring Provider:  No ref. provider found      ASSESSMENT AND PLAN;   #1. GERD #2. Post prandial cough  #3. Family history of colon cancer (dad at age 60).  Negative colon 04/2016 except for pancolonic diverticulosis, internal hemorrhoids.  Next due 05/07/2019  Plan: - Start protonix 40mg  po qAM #30, 6 refills. - Pepcid 20mg  po qhs - EGD for further evaluation. - If still with problems especially with cough and hoarseness, recommend ENT evaluation. - Avoid all fried foods and sodas.  Can have grilled.  This will help to lose weight as well.  I have instructed patient that he needs to monitor his weight.  Nonpharmacologic means of reflux control was stressed. - Recommend screening colonoscopy December 2020.   HPI:    Shawn Fox is a 62 y.o. male  With postprandial cough x 9 months off and on especially after eating. He has been having intermittent sore throat, mucus in the throat and congestion.  He called ENT and was suggested to follow-up with GI as it could be related to reflux.  Patient with history of recent bronchitis. Occasional heartburn and water brash in. He also has history of sleep apnea but has not been able to use CPAP. Gained 10 pounds over the last 1 year. No melena or hematochezia. Denies having any significant diarrhea or constipation. Had EGD 04/2005-erosive esophagitis, moderate gastritis Wife - RN Past Medical History:  Diagnosis Date  . Arthritis   . Bronchitis, simple, chronic (HCC)    has after flu or colds.  Had flu 1 month ago.  . Carpal tunnel syndrome   . Complication of anesthesia    vomiting  . Cough    recent-  . DJD (degenerative joint disease)   . Fatty liver   . GERD (gastroesophageal reflux disease)   . IBS (irritable bowel syndrome)   . Kidney stone   . Pneumonia    had viral pneumonia 11/12-better  . PONV (postoperative nausea and vomiting)   . Sleep apnea    used to have it-lost wt-does not have  cpap anymore    Past Surgical History:  Procedure Laterality Date  . CARPAL TUNNEL RELEASE     both  . CHOLECYSTECTOMY    . COLONOSCOPY  05/13/2016   Diverticulosis without bleeding pancolonic diverticulosis more prominent in sigmoid colon internal hemorrhoids second degree  . CYSTOSCOPY W/ STONE MANIPULATION    . ESOPHAGOGASTRODUODENOSCOPY  04/27/2005   normal esophagus moderate gastritis mainly involing the antrum in form of streaky erythemav duodenum normal  . 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 KNEE ARTHROSCOPY     left 3 x  . KNEE ARTHROSCOPY     rt x2  . MASS EXCISION  04/27/2011   Procedure: EXCISION MASS;  Surgeon: Nestor LewandowskyFrank J Rowan;  Location: [facility];  Service: Orthopedics;  Laterality: Right;  RT 4TH FINGER GANGLION CYST EXCISION    Family History  Problem Relation Age of Onset  . Colon cancer Father   . Esophageal cancer Paternal Grandfather     Social History   Tobacco Use  . Smoking status: Never Smoker  . Smokeless tobacco: Never Used  Substance Use Topics  . Alcohol use: Not Currently    Comment: last one 3 years   . Drug use: No    Current Outpatient Medications  Medication Sig Dispense Refill  . B Complex Vitamins (VITAMIN B COMPLEX PO) Take 1 tablet  by mouth daily.    . Multiple Vitamins-Minerals (CENTRUM SILVER 50+MEN PO) daily.    . TURMERIC PO Take 1 tablet by mouth daily.    Marland Kitchen VITAMIN D, CHOLECALCIFEROL, PO Take 1 tablet by mouth daily.     No current facility-administered medications for this visit.     Allergies  Allergen Reactions  . Demerol Nausea And Vomiting    Review of Systems:  Constitutional: Denies fever, chills, diaphoresis, appetite change and fatigue.  HEENT: Denies photophobia, eye pain, redness, hearing loss, ear pain, congestion, sore throat, rhinorrhea, sneezing, mouth sores, neck pain, neck stiffness and tinnitus.   Respiratory: Denies SOB, DOE, cough, chest tightness,  and wheezing.    Cardiovascular: Denies chest pain, palpitations and leg swelling.  Genitourinary: Denies dysuria, urgency, frequency, hematuria, flank pain and difficulty urinating.  Musculoskeletal: Denies myalgias, back pain, joint swelling, arthralgias and gait problem.  Skin: No rash.  Neurological: Denies dizziness, seizures, syncope, weakness, light-headedness, numbness and headaches.  Hematological: Denies adenopathy. Easy bruising, personal or family bleeding history  Psychiatric/Behavioral: No anxiety or depression     Physical Exam:    BP (!) 144/90   Pulse 80   Ht 5\' 9"  (1.753 m)   Wt 259 lb 4 oz (117.6 kg)   BMI 38.28 kg/m  Filed Weights   04/12/18 1124  Weight: 259 lb 4 oz (117.6 kg)   Constitutional:  Well-developed, in no acute distress. Psychiatric: Normal mood and affect. Behavior is normal. HEENT: Pupils normal.  Conjunctivae are normal. No scleral icterus.  Normal throat. Neck supple.  Cardiovascular: Normal rate, regular rhythm. No edema Pulmonary/chest: Effort normal and breath sounds normal. No wheezing, rales or rhonchi. Abdominal: Soft, nondistended. Nontender. Bowel sounds active throughout. There are no masses palpable. No hepatomegaly. Rectal:  defered Neurological: Alert and oriented to person place and time. Skin: Skin is warm and dry. No rashes noted.   Edman Circle, MD 04/12/2018, 11:47 AM  Cc: No ref. provider found

## 2018-04-12 NOTE — Patient Instructions (Addendum)
If you are age 60 or older, your body mass index should be between 23-30. Your Body mass index is 38.28 kg/m. If this is out of the aforementioned range listed, please consider follow up with your Primary Care Provider.  If you are age 60 or younger, your body mass index should be between 19-25. Your Body mass index is 38.28 kg/m. If this is out of the aformentioned range listed, please consider follow up with your Primary Care Provider.   We have sent the following medications to your pharmacy for you to pick up at your convenience: Protonix 40 mg once daily.  Pepcid 20 mg at bedtime.   You have been scheduled for an endoscopy. Please follow written instructions given to you at your visit today. If you use inhalers (even only as needed), please bring them with you on the day of your procedure. Your physician has requested that you go to www.startemmi.com and enter the access code given to you at your visit today. This web site gives a general overview about your procedure. However, you should still follow specific instructions given to you by our office regarding your preparation for the procedure.   Food Choices for Gastroesophageal Reflux Disease, Adult When you have gastroesophageal reflux disease (GERD), the foods you eat and your eating habits are very important. Choosing the right foods can help ease your discomfort. What guidelines do I need to follow?  Choose fruits, vegetables, whole grains, and low-fat dairy products.  Choose low-fat meat, fish, and poultry.  Limit fats such as oils, salad dressings, butter, nuts, and avocado.  Keep a food diary. This helps you identify foods that cause symptoms.  Avoid foods that cause symptoms. These may be different for everyone.  Eat small meals often instead of 3 large meals a day.  Eat your meals slowly, in a place where you are relaxed.  Limit fried foods.  Cook foods using methods other than frying.  Avoid drinking  alcohol.  Avoid drinking large amounts of liquids with your meals.  Avoid bending over or lying down until 2-3 hours after eating. What foods are not recommended? These are some foods and drinks that may make your symptoms worse: Vegetables Tomatoes. Tomato juice. Tomato and spaghetti sauce. Chili peppers. Onion and garlic. Horseradish. Fruits Oranges, grapefruit, and lemon (fruit and juice). Meats High-fat meats, fish, and poultry. This includes hot dogs, ribs, ham, sausage, salami, and bacon. Dairy Whole milk and chocolate milk. Sour cream. Cream. Butter. Ice cream. Cream cheese. Drinks Coffee and tea. Bubbly (carbonated) drinks or energy drinks. Condiments Hot sauce. Barbecue sauce. Sweets/Desserts Chocolate and cocoa. Donuts. Peppermint and spearmint. Fats and Oils High-fat foods. This includes JamaicaFrench fries and potato chips. Other Vinegar. Strong spices. This includes black pepper, white pepper, red pepper, cayenne, curry powder, cloves, ginger, and chili powder. The items listed above may not be a complete list of foods and drinks to avoid. Contact your dietitian for more information. This information is not intended to replace advice given to you by your health care provider. Make sure you discuss any questions you have with your health care provider. Document Released: 11/03/2011 Document Revised: 10/10/2015 Document Reviewed: 03/08/2013 Elsevier Interactive Patient Education  2017 Elsevier Inc.   Thank you,  Dr. Lynann Bolognaajesh Gupta

## 2018-05-24 ENCOUNTER — Encounter: Payer: Self-pay | Admitting: Gastroenterology

## 2018-05-25 ENCOUNTER — Telehealth: Payer: Self-pay | Admitting: Gastroenterology

## 2018-05-25 NOTE — Telephone Encounter (Signed)
Pt wants to know if he can take his BP med lisinoprol the day of his egd. It is scheduled on 05/30/18.

## 2018-05-25 NOTE — Telephone Encounter (Signed)
Called and spoke with patient- patient informed to take BP med the morning of the procedure just to ensure he takes this medication before the 3 hr cut off of NPO status; Patient verbalized understanding of information/instructions; Patient was advised to call back if questions/concerns arise;

## 2018-05-30 ENCOUNTER — Encounter: Payer: Self-pay | Admitting: Gastroenterology

## 2018-05-30 ENCOUNTER — Ambulatory Visit (AMBULATORY_SURGERY_CENTER): Payer: BLUE CROSS/BLUE SHIELD | Admitting: Gastroenterology

## 2018-05-30 VITALS — BP 99/57 | HR 69 | Temp 98.7°F | Resp 11 | Ht 69.0 in | Wt 259.0 lb

## 2018-05-30 DIAGNOSIS — K3189 Other diseases of stomach and duodenum: Secondary | ICD-10-CM | POA: Diagnosis not present

## 2018-05-30 DIAGNOSIS — K2270 Barrett's esophagus without dysplasia: Secondary | ICD-10-CM

## 2018-05-30 DIAGNOSIS — R059 Cough, unspecified: Secondary | ICD-10-CM

## 2018-05-30 DIAGNOSIS — K317 Polyp of stomach and duodenum: Secondary | ICD-10-CM

## 2018-05-30 DIAGNOSIS — K219 Gastro-esophageal reflux disease without esophagitis: Secondary | ICD-10-CM

## 2018-05-30 DIAGNOSIS — R05 Cough: Secondary | ICD-10-CM

## 2018-05-30 DIAGNOSIS — K2970 Gastritis, unspecified, without bleeding: Secondary | ICD-10-CM | POA: Diagnosis not present

## 2018-05-30 MED ORDER — PANTOPRAZOLE SODIUM 40 MG PO TBEC: 40.0000 mg | DELAYED_RELEASE_TABLET | Freq: Two times a day (BID) | ORAL | 6 refills | Status: DC

## 2018-05-30 MED ORDER — SODIUM CHLORIDE 0.9 % IV SOLN: 500.0000 mL | Freq: Once | INTRAVENOUS | Status: DC

## 2018-05-30 NOTE — Patient Instructions (Signed)
YOU HAD AN ENDOSCOPIC PROCEDURE TODAY AT THE [facility]:   Refer to the procedure report that was given to you for any specific questions about what was found during the examination.  If the procedure report does not answer your questions, please call your gastroenterologist to clarify.  If you requested that your care partner not be given the details of your procedure findings, then the procedure report has been included in a sealed envelope for you to review at your convenience later.  YOU SHOULD EXPECT: Some feelings of bloating in the abdomen. Passage of more gas than usual.  Walking can help get rid of the air that was put into your GI tract during the procedure and reduce the bloating. If you had a lower endoscopy (such as a colonoscopy or flexible sigmoidoscopy) you may notice spotting of blood in your stool or on the toilet paper. If you underwent a bowel prep for your procedure, you may not have a normal bowel movement for a few days.  Please Note:  You might notice some irritation and congestion in your nose or some drainage.  This is from the oxygen used during your procedure.  There is no need for concern and it should clear up in a day or so.  SYMPTOMS TO REPORT IMMEDIATELY:   Following upper endoscopy (EGD)  Vomiting of blood or coffee ground material  New chest pain or pain under the shoulder blades  Painful or persistently difficult swallowing  New shortness of breath  Fever of 100F or higher  Black, tarry-looking stools  For urgent or emergent issues, a gastroenterologist can be reached at any hour by calling [phone].   DIET:  We do recommend a small meal at first, but then you may proceed to your regular diet.  Drink plenty of fluids but you should avoid alcoholic beverages for 24 hours.  ACTIVITY:  You should plan to take it easy for the rest of today and you should NOT DRIVE or use heavy machinery until tomorrow (because of the sedation medicines used  during the test).    FOLLOW UP: Our staff will call the number listed on your records the next business day following your procedure to check on you and address any questions or concerns that you may have regarding the information given to you following your procedure. If we do not reach you, we will leave a message.  However, if you are feeling well and you are not experiencing any problems, there is no need to return our call.  We will assume that you have returned to your regular daily activities without incident.  If any biopsies were taken you will be contacted by phone or by letter within the next 1-3 weeks.  Please call us at [phone] if you have not heard about the biopsies in 3 weeks.    SIGNATURES/CONFIDENTIALITY: You and/or your care partner have signed paperwork which will be entered into your electronic medical record.  These signatures attest to the fact that that the information above on your After Visit Summary has been reviewed and is understood.  Full responsibility of the confidentiality of this discharge information lies with you and/or your care-partner. 

## 2018-05-30 NOTE — Progress Notes (Signed)
To PACU, VSS. Report to [email or web address] 

## 2018-05-30 NOTE — Progress Notes (Signed)
Called to room to assist during endoscopic procedure.  Patient ID and intended procedure confirmed with present staff. Received instructions for my participation in the procedure from the performing physician.  

## 2018-05-30 NOTE — Op Note (Signed)
[facility] Patient Name: Shawn Fox Procedure Date: 05/30/2018 11:23 AM MRN: [id number] Endoscopist: Shawn Fox , MD Age: 63 Referring MD:  Date of Birth: 1956/01/17 Gender: Male Account #: [id number] Procedure:                Upper GI endoscopy Indications:              GERD with chronic cough, Failure to respond to                            medical treatment Medicines:                Monitored Anesthesia Care Procedure:                Pre-Anesthesia Assessment:                           - Prior to the procedure, a History and Physical                            was performed, and patient medications and                            allergies were reviewed. The patient's tolerance of                            previous anesthesia was also reviewed. The risks                            and benefits of the procedure and the sedation                            options and risks were discussed with the patient.                            All questions were answered, and informed consent                            was obtained. Prior Anticoagulants: The patient has                            taken no previous anticoagulant or antiplatelet                            agents. ASA Grade Assessment: II - A patient with                            mild systemic disease. After reviewing the risks                            and benefits, the patient was deemed in                            satisfactory condition to undergo the procedure.  After obtaining informed consent, the endoscope was                            passed under direct vision. Throughout the                            procedure, the patient's blood pressure, pulse, and                            oxygen saturations were monitored continuously. The                            Endoscope was introduced through the mouth, and                            advanced to the second part of duodenum. The  upper                            GI endoscopy was accomplished without difficulty.                            The patient tolerated the procedure well. Scope In: Scope Out: Findings:                 The Z-line was irregular and was found 40 cm from                            the incisors. Biopsies were taken with a cold                            forceps for histology directed by NBI. Biopsies                            were also obtained from the proximal and mid                            esophagus with cold forceps for histology to r/o                            eosinophilic esophagitis.                           Localized mild inflammation characterized by                            erythema was found in the gastric antrum. Biopsies                            were taken with a cold forceps for histology.                           A single 10 mm sessile polyp with no stigmata of  recent bleeding was found in the gastric body. The                            polyp was removed with a hot snare. Resection and                            retrieval were complete.                           The examined duodenum was normal. Complications:            No immediate complications. Estimated Blood Loss:     Estimated blood loss: none. Impression:               - Z-line irregular, 40 cm from the incisors.                            Biopsied.                           - Mild gastritis. Biopsied.                           - A single gastric polyp. Resected and retrieved. Recommendation:           - Patient has a contact number available for                            emergencies. The signs and symptoms of potential                            delayed complications were discussed with the                            patient. Return to normal activities tomorrow.                            Written discharge instructions were provided to the                            patient.                            - Resume previous diet.                           - Continue present medications (continue Protonix                            40 mg p.o. every morning and Pepcid 20 mg p.o.                            nightly). Of note that he has recently been started                            on lisinopril. However, cough precedes lisinopril.                           -  Await pathology results.                           - Refer to an ENT specialist in 2 weeks.                           - If he continues to have problems and the entire                            work-up is negative, would consider pH                            study/esophageal manometry. Shawn Bolognaajesh Macario Shear, MD 05/30/2018 11:48:44 AM This report has been signed electronically.

## 2018-05-31 ENCOUNTER — Telehealth: Payer: Self-pay | Admitting: *Deleted

## 2018-05-31 ENCOUNTER — Other Ambulatory Visit: Payer: Self-pay

## 2018-05-31 NOTE — Telephone Encounter (Signed)
Pt returned call and said he is doing good from his procedure yesterday

## 2018-05-31 NOTE — Telephone Encounter (Signed)
No answer, message left for the patient. 

## 2018-05-31 NOTE — Progress Notes (Signed)
Encounter error

## 2018-06-07 ENCOUNTER — Telehealth: Payer: Self-pay | Admitting: Gastroenterology

## 2018-06-07 ENCOUNTER — Encounter: Payer: Self-pay | Admitting: Gastroenterology

## 2018-06-07 NOTE — Telephone Encounter (Signed)
I have submitted a PA for the pantoprazole.

## 2018-06-07 NOTE — Telephone Encounter (Signed)
Called and spoke with patient's wife-Lisa- she reports the patient is being seen by a pulmonologist and then will consult an ENT for further evaluation; ENT referral was recommended by Dr. Chales Abrahams post upper gi endo procedure; Misty Stanley reports if they have trouble getting in with an ENT she will call or have the patient call the office for the office to submit the referral to ENT; Misty Stanley is questioning the results of the upper gi endoscopy procedure-please advise;

## 2018-06-07 NOTE — Telephone Encounter (Signed)
Have just sent a letter regarding EGD biopsies For ENT- RE: cough with hoarseness.  If you have problems getting him into ENT clinic.  Please let me know.  I will call Dr. Verdie DrownPincus

## 2018-06-07 NOTE — Telephone Encounter (Signed)
Called and spoke with patient's wife-Lisa-she was informed of result note from EGD (letter was read to her) and MD recommendations for ENT referral; Misty Stanley is agreeable with plan of care; Misty Stanley verbalized understanding of information/instructions;  Misty Stanley was advised to call the office at [phone] if questions/concerns arise;  Misty Stanley was informed her husband has been scheduled with Dr. Levonne Hubert at [facility] on 06/13/2018 arrival at 8:45 am for a 9:00 am appt;

## 2018-06-08 ENCOUNTER — Telehealth: Payer: Self-pay | Admitting: Gastroenterology

## 2018-06-08 DIAGNOSIS — R05 Cough: Secondary | ICD-10-CM

## 2018-06-08 DIAGNOSIS — R059 Cough, unspecified: Secondary | ICD-10-CM

## 2018-06-08 NOTE — Telephone Encounter (Signed)
Called and spoke with tech at [facility] pharmacy -patient's insurance is still saying the medication is requiring a PA due to Protonix being prescribed BID; please check on this PA or route to MD if RX needing to be changed-

## 2018-06-08 NOTE — Telephone Encounter (Signed)
Patient states he thought Dr.Gupta was prescribing him medication pantoprazole 2x daily instead of famotidine but when he went to the pharmacy they did not have the prescription right. Patient requesting call back to make sure that is what Dr.Gupta wanted him to do.

## 2018-06-09 MED ORDER — PANTOPRAZOLE SODIUM 40 MG PO TBEC: 40.0000 mg | DELAYED_RELEASE_TABLET | Freq: Two times a day (BID) | ORAL | 4 refills | Status: DC

## 2018-06-09 NOTE — Telephone Encounter (Signed)
Your procedure note says Protonix 40 mg in the morning and Pepcid 20 mg at bedtime. Do you want the patient to take it this way, or do you want Protonix twice daily?

## 2018-06-09 NOTE — Telephone Encounter (Signed)
Lets just to Protonix 40 mg p.o. twice daily 60, 4 refills for now Hold off on Pepcid

## 2018-06-09 NOTE — Telephone Encounter (Signed)
Patient says that when he takes Pepcid that it causes him to have a dry scratchy throat and makes him cough in the morning until he throws up. Would you like to do Protonix twice daily?

## 2018-06-09 NOTE — Telephone Encounter (Signed)
Sent prescription to patients pharmacy, patient was notified.  

## 2018-06-09 NOTE — Telephone Encounter (Signed)
Lets to Protonix 40 in the morning and Pepcid 20 at bedtime for now If he still has problems, then increase Protonix to 40 mg p.o. twice daily and keep Pepcid at bedtime

## 2018-06-13 ENCOUNTER — Telehealth: Payer: Self-pay | Admitting: Gastroenterology

## 2018-06-13 NOTE — Telephone Encounter (Signed)
PT advised that Dr. Chales Abrahams referred him to a ENT in [place] but would prefer to be someone in [place]. JG

## 2018-06-16 NOTE — Telephone Encounter (Signed)
Patient returned call to office=patient has requested that he be referred to alternative ENT; patient provided name of MD he would like referral sent to: [facility] [place], [place] Su Philomena Doheny, MD  Paper work for referral has been faxed to this office at [phone];

## 2018-06-16 NOTE — Telephone Encounter (Signed)
Left message for patient to call back to inform office of specific ENT the patient would like to be referred to;

## 2018-06-20 NOTE — Telephone Encounter (Signed)
Notified by Dr. Suszanne Conners office staff- patient has been scheduled for referral to office on 07/21/2018 at 1:30 pm; Dr. Chaney Malling office staff advised they would notify the patient of the appt date/time;

## 2018-07-21 ENCOUNTER — Ambulatory Visit (INDEPENDENT_AMBULATORY_CARE_PROVIDER_SITE_OTHER): Payer: BLUE CROSS/BLUE SHIELD | Admitting: Otolaryngology

## 2018-07-21 DIAGNOSIS — J343 Hypertrophy of nasal turbinates: Secondary | ICD-10-CM | POA: Diagnosis not present

## 2018-07-21 DIAGNOSIS — J310 Chronic rhinitis: Secondary | ICD-10-CM | POA: Diagnosis not present

## 2018-07-21 DIAGNOSIS — R0982 Postnasal drip: Secondary | ICD-10-CM | POA: Diagnosis not present

## 2019-05-09 ENCOUNTER — Encounter: Payer: Self-pay | Admitting: Gastroenterology

## 2019-06-08 ENCOUNTER — Ambulatory Visit (AMBULATORY_SURGERY_CENTER): Payer: BC Managed Care – PPO | Admitting: *Deleted

## 2019-06-08 ENCOUNTER — Other Ambulatory Visit: Payer: Self-pay

## 2019-06-08 VITALS — Ht 69.5 in | Wt 256.0 lb

## 2019-06-08 DIAGNOSIS — Z800 Family history of malignant neoplasm of digestive organs: Secondary | ICD-10-CM

## 2019-06-08 MED ORDER — SUPREP BOWEL PREP KIT 17.5-3.13-1.6 GM/177ML PO SOLN: 1.0000 | Freq: Once | ORAL | 0 refills | Status: AC

## 2019-06-08 NOTE — Progress Notes (Signed)
Pt drove to [facility] office for a PV- will do a virtual PV for pt  Pt verified name, DOB, address and insurance during PV today. Pt mailed instruction packet to included paper to complete and mail back to [facility] with addressed and stamped envelope, Emmi video, copy of consent form to read and not return, and instructions. Suprep $15  coupon mailed in packet. PV completed over the phone. Pt encouraged to call with questions or issues   No egg or soy allergy known to patient   issues with past sedation with any surgeries  or procedures of PONV  no intubation problems  No diet pills per patient No home 02 use per patient  No blood thinners per patient  Pt denies issues with constipation  No A fib or A flutter  EMMI video sent to pt's e mail   Due to the COVID-19 pandemic we are asking patients to follow these guidelines. Please only bring one care partner. Please be aware that your care partner may wait in the car in the parking lot or if they feel like they will be too hot to wait in the car, they may wait in the lobby on the 4th floor. All care partners are required to wear a mask the entire time (we do not have any that we can provide them), they need to practice social distancing, and we will do a Covid check for all patient's and care partners when you arrive. Also we will check their temperature and your temperature. If the care partner waits in their car they need to stay in the parking lot the entire time and we will call them on their cell phone when the patient is ready for discharge so they can bring the car to the front of the building. Also all patient's will need to wear a mask into building.

## 2019-06-15 ENCOUNTER — Encounter: Payer: Self-pay | Admitting: Gastroenterology

## 2019-06-16 ENCOUNTER — Other Ambulatory Visit (HOSPITAL_COMMUNITY): Payer: Self-pay | Admitting: Otolaryngology

## 2019-06-16 ENCOUNTER — Other Ambulatory Visit: Payer: Self-pay | Admitting: Otolaryngology

## 2019-06-16 DIAGNOSIS — J320 Chronic maxillary sinusitis: Secondary | ICD-10-CM

## 2019-06-19 ENCOUNTER — Other Ambulatory Visit: Payer: Self-pay

## 2019-06-19 ENCOUNTER — Other Ambulatory Visit (HOSPITAL_COMMUNITY)
Admission: RE | Admit: 2019-06-19 | Discharge: 2019-06-19 | Disposition: A | Discharge status: Home / Self Care | Payer: BC Managed Care – PPO | Source: Ambulatory Visit | Attending: Gastroenterology | Admitting: Gastroenterology

## 2019-06-19 DIAGNOSIS — Z01812 Encounter for preprocedural laboratory examination: Secondary | ICD-10-CM | POA: Insufficient documentation

## 2019-06-19 DIAGNOSIS — Z20822 Contact with and (suspected) exposure to covid-19: Secondary | ICD-10-CM | POA: Diagnosis not present

## 2019-06-19 LAB — SARS CORONAVIRUS 2 (TAT 6-24 HRS): SARS Coronavirus 2: NEGATIVE

## 2019-06-22 ENCOUNTER — Encounter: Payer: Self-pay | Admitting: Gastroenterology

## 2019-06-22 ENCOUNTER — Ambulatory Visit (AMBULATORY_SURGERY_CENTER): Payer: BC Managed Care – PPO | Admitting: Gastroenterology

## 2019-06-22 ENCOUNTER — Other Ambulatory Visit: Payer: Self-pay

## 2019-06-22 VITALS — BP 119/72 | HR 67 | Temp 97.1°F | Resp 13 | Ht 69.0 in | Wt 259.0 lb

## 2019-06-22 DIAGNOSIS — Z1211 Encounter for screening for malignant neoplasm of colon: Secondary | ICD-10-CM | POA: Diagnosis not present

## 2019-06-22 DIAGNOSIS — Z800 Family history of malignant neoplasm of digestive organs: Secondary | ICD-10-CM

## 2019-06-22 MED ORDER — SODIUM CHLORIDE 0.9 % IV SOLN: 500.0000 mL | Freq: Once | INTRAVENOUS | Status: DC

## 2019-06-22 MED ORDER — HYDROCORTISONE (PERIANAL) 2.5 % EX CREA: 1.0000 "application " | TOPICAL_CREAM | Freq: Two times a day (BID) | CUTANEOUS | 1 refills | Status: AC | PRN

## 2019-06-22 NOTE — Op Note (Signed)
[facility] Patient Name: Shawn Fox Procedure Date: 06/22/2019 8:42 AM MRN: [id number] Endoscopist: Jackquline Denmark , MD Age: 64 Referring MD:  Date of Birth: [birth year] Gender: Male Account #: [id number] Procedure:                Colonoscopy Indications:              Screening in patient at increased risk: Colorectal                            cancer in father 20 or older Medicines:                Monitored Anesthesia Care Procedure:                Pre-Anesthesia Assessment:                           - Prior to the procedure, a History and Physical                            was performed, and patient medications and                            allergies were reviewed. The patient's tolerance of                            previous anesthesia was also reviewed. The risks                            and benefits of the procedure and the sedation                            options and risks were discussed with the patient.                            All questions were answered, and informed consent                            was obtained. Prior Anticoagulants: The patient has                            taken no previous anticoagulant or antiplatelet                            agents. ASA Grade Assessment: II - A patient with                            mild systemic disease. After reviewing the risks                            and benefits, the patient was deemed in                            satisfactory condition to undergo the procedure.  After obtaining informed consent, the colonoscope                            was passed under direct vision. Throughout the                            procedure, the patient's blood pressure, pulse, and                            oxygen saturations were monitored continuously. The                            Colonoscope was introduced through the anus and                            advanced to the the cecum,  identified by                            appendiceal orifice and ileocecal valve. The                            colonoscopy was performed without difficulty. The                            patient tolerated the procedure well. The quality                            of the bowel preparation was good. The ileocecal                            valve, appendiceal orifice, and rectum were                            photographed. Scope In: 8:46:12 AM Scope Out: 8:56:14 AM Scope Withdrawal Time: 0 hours 7 minutes 21 seconds  Total Procedure Duration: 0 hours 10 minutes 2 seconds  Findings:                 Multiple small-mouthed diverticula were found in                            the sigmoid colon, few in descending colon and                            ascending colon.                           Non-bleeding internal hemorrhoids were found during                            retroflexion. The hemorrhoids were small.                           The exam was otherwise without abnormality on  direct and retroflexion views. Complications:            No immediate complications. Estimated Blood Loss:     Estimated blood loss: none. Impression:               -Mild pancolonic diverticulosis predominantly in                            the sigmoid colon.                           -Non-bleeding internal hemorrhoids.                           -Otherwise normal colonoscopy. Recommendation:           - Patient has a contact number available for                            emergencies. The signs and symptoms of potential                            delayed complications were discussed with the                            patient. Return to normal activities tomorrow.                            Written discharge instructions were provided to the                            patient.                           - High fiber diet.                           - Continue present medications.                            - Repeat colonoscopy in 5 years for screening                            purposes. Earlier, if with any new problems or if                            there is any change in family history.                           - Return to GI office PRN.                           - Use HC Cream 2.5%: Apply externally BID for 10                            days prn. Lynann Bologna, MD 06/22/2019 9:02:42 AM This report has been signed electronically.

## 2019-06-22 NOTE — Patient Instructions (Signed)
HANDOUTS PROVIDED ON: HIGH FIBER DIET, DIVERTICULOSIS, & HEMORRHOIDS  You may resume your previous diet and medication schedule.  Thank you for allowing [place] to care for you today!!!  YOU HAD AN ENDOSCOPIC PROCEDURE TODAY AT THE [facility]:   Refer to the procedure report that was given to you for any specific questions about what was found during the examination.  If the procedure report does not answer your questions, please call your gastroenterologist to clarify.  If you requested that your care partner not be given the details of your procedure findings, then the procedure report has been included in a sealed envelope for you to review at your convenience later.  YOU SHOULD EXPECT: Some feelings of bloating in the abdomen. Passage of more gas than usual.  Walking can help get rid of the air that was put into your GI tract during the procedure and reduce the bloating. If you had a lower endoscopy (such as a colonoscopy or flexible sigmoidoscopy) you may notice spotting of blood in your stool or on the toilet paper. If you underwent a bowel prep for your procedure, you may not have a normal bowel movement for a few days.  Please Note:  You might notice some irritation and congestion in your nose or some drainage.  This is from the oxygen used during your procedure.  There is no need for concern and it should clear up in a day or so.  SYMPTOMS TO REPORT IMMEDIATELY:   Following lower endoscopy (colonoscopy or flexible sigmoidoscopy):  Excessive amounts of blood in the stool  Significant tenderness or worsening of abdominal pains  Swelling of the abdomen that is new, acute  Fever of 100F or higher  For urgent or emergent issues, a gastroenterologist can be reached at any hour by calling [phone].   DIET:  We do recommend a small meal at first, but then you may proceed to your regular diet.  Drink plenty of fluids but you should avoid alcoholic beverages for 24  hours.  ACTIVITY:  You should plan to take it easy for the rest of today and you should NOT DRIVE or use heavy machinery until tomorrow (because of the sedation medicines used during the test).    FOLLOW UP: Our staff will call the number listed on your records 48-72 hours following your procedure to check on you and address any questions or concerns that you may have regarding the information given to you following your procedure. If we do not reach you, we will leave a message.  We will attempt to reach you two times.  During this call, we will ask if you have developed any symptoms of COVID 19. If you develop any symptoms (ie: fever, flu-like symptoms, shortness of breath, cough etc.) before then, please call [phone].  If you test positive for Covid 19 in the 2 weeks post procedure, please call and report this information to [place].    If any biopsies were taken you will be contacted by phone or by letter within the next 1-3 weeks.  Please call us at [phone] if you have not heard about the biopsies in 3 weeks.    SIGNATURES/CONFIDENTIALITY: You and/or your care partner have signed paperwork which will be entered into your electronic medical record.  These signatures attest to the fact that that the information above on your After Visit Summary has been reviewed and is understood.  Full responsibility of the confidentiality of this discharge information lies with you  care-partner.  

## 2019-06-22 NOTE — Progress Notes (Signed)
A and O x3. Report to RN. Tolerated MAC anesthesia well.

## 2019-06-22 NOTE — Progress Notes (Signed)
Pt's states no medical or surgical changes since previsit or office visit.  Temp taken by JB VS taken by DT 

## 2019-06-26 ENCOUNTER — Telehealth: Payer: Self-pay

## 2019-06-26 NOTE — Telephone Encounter (Signed)
First post procedure follow up call, no answer 

## 2019-06-26 NOTE — Telephone Encounter (Signed)
  Follow up Call-  Call back number 06/22/2019 05/30/2018  Post procedure Call Back phone  # [phone]  Permission to leave phone message Yes Yes  Some recent data might be hidden     Patient questions:  Do you have a fever, pain , or abdominal swelling? No. Pain Score  0 *  Have you tolerated food without any problems? Yes.    Have you been able to return to your normal activities? Yes.    Do you have any questions about your discharge instructions: Diet   No. Medications  No. Follow up visit  No.  Do you have questions or concerns about your Care? No.  Actions: * If pain score is 4 or above: 1. No action needed, pain <4.Have you developed a fever since your procedure? no  2.   Have you had an respiratory symptoms (SOB or cough) since your procedure? no  3.   Have you tested positive for COVID 19 since your procedure no  4.   Have you had any family members/close contacts diagnosed with the COVID 19 since your procedure?  no   If yes to any of these questions please route to Laverna Peace, RN and Jennye Boroughs, Charity fundraiser.

## 2019-06-27 ENCOUNTER — Other Ambulatory Visit: Payer: Self-pay | Admitting: Gastroenterology

## 2019-06-27 DIAGNOSIS — R05 Cough: Secondary | ICD-10-CM

## 2019-06-27 DIAGNOSIS — R059 Cough, unspecified: Secondary | ICD-10-CM

## 2019-06-29 ENCOUNTER — Ambulatory Visit (HOSPITAL_COMMUNITY)
Admission: RE | Admit: 2019-06-29 | Discharge: 2019-06-29 | Disposition: A | Discharge status: Home / Self Care | Payer: BC Managed Care – PPO | Source: Ambulatory Visit | Attending: Otolaryngology | Admitting: Otolaryngology

## 2019-06-29 ENCOUNTER — Other Ambulatory Visit: Payer: Self-pay

## 2019-06-29 DIAGNOSIS — J320 Chronic maxillary sinusitis: Secondary | ICD-10-CM

## 2019-07-01 ENCOUNTER — Other Ambulatory Visit: Payer: Self-pay | Admitting: Gastroenterology

## 2019-07-01 DIAGNOSIS — R059 Cough, unspecified: Secondary | ICD-10-CM

## 2019-07-01 DIAGNOSIS — R05 Cough: Secondary | ICD-10-CM

## 2019-08-06 NOTE — Progress Notes (Signed)
Phone call from Dr. Avel Sensor office 08/04/19, asked for images to be sent to [facility] for a referral. Sent images through [facility], Dr. Robinette Haines office faxed over release form, scanned in to documents.

## 2019-08-06 NOTE — Addendum Note (Signed)
Encounter addended by: Alvina Filbert B, RT on: 08/06/2019 2:17 AM  Actions taken: Imaging Exam ended

## 2019-11-27 ENCOUNTER — Other Ambulatory Visit: Payer: Self-pay | Admitting: Gastroenterology

## 2019-11-27 DIAGNOSIS — R059 Cough, unspecified: Secondary | ICD-10-CM

## 2020-02-20 ENCOUNTER — Other Ambulatory Visit: Payer: Self-pay | Admitting: Gastroenterology

## 2020-02-20 DIAGNOSIS — R059 Cough, unspecified: Secondary | ICD-10-CM

## 2020-05-03 ENCOUNTER — Ambulatory Visit (INDEPENDENT_AMBULATORY_CARE_PROVIDER_SITE_OTHER): Payer: BC Managed Care – PPO | Admitting: Gastroenterology

## 2020-05-03 ENCOUNTER — Encounter: Payer: Self-pay | Admitting: Gastroenterology

## 2020-05-03 VITALS — BP 124/64 | HR 76 | Ht 69.0 in | Wt 263.4 lb

## 2020-05-03 DIAGNOSIS — R059 Cough, unspecified: Secondary | ICD-10-CM | POA: Diagnosis not present

## 2020-05-03 DIAGNOSIS — K625 Hemorrhage of anus and rectum: Secondary | ICD-10-CM

## 2020-05-03 DIAGNOSIS — K219 Gastro-esophageal reflux disease without esophagitis: Secondary | ICD-10-CM | POA: Diagnosis not present

## 2020-05-03 MED ORDER — PANTOPRAZOLE SODIUM 40 MG PO TBEC: 40.0000 mg | DELAYED_RELEASE_TABLET | Freq: Every morning | ORAL | 4 refills | Status: DC

## 2020-05-03 NOTE — Patient Instructions (Addendum)
If you are age 62 or older, your body mass index should be between 23-30. Your Body mass index is 38.89 kg/m. If this is out of the aforementioned range listed, please consider follow up with your Primary Care Provider.  If you are age 37 or younger, your body mass index should be between 19-25. Your Body mass index is 38.89 kg/m. If this is out of the aformentioned range listed, please consider follow up with your Primary Care Provider.  ' We have sent the following medications to your pharmacy for you to pick up at your convenience: Protonix 40 mg 1 tablet daily.  Prior to your next routine visit please have the following labs drawb: a B12 and Magnesium.  Due to recent changes in healthcare laws, you may see the results of your imaging and laboratory studies on MyChart before your provider has had a chance to review them.  We understand that in some cases there may be results that are confusing or concerning to you. Not all laboratory results come back in the same time frame and the provider may be waiting for multiple results in order to interpret others.  Please give [place] 48 hours in order for your provider to thoroughly review all the results before contacting the office for clarification of your results.   It was a pleasure to see you today!  Dr Chales Abrahams

## 2020-05-03 NOTE — Progress Notes (Signed)
Chief Complaint: GERD  Referring Provider:  Pomposini, Rande Brunt, MD      ASSESSMENT AND PLAN;   #1. 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 #2. Int hoids. Nl CBC #3. FH of colon cancer (dad at age 62).  Negative colon 06/2019 except for pancolonic diverticulosis, internal hemorrhoids.  Next due 06/2022  Plan: - Protonix 40mg  po qAM #90, 4 refills. - EGD/colon Jan/Feb 2024  - Encouraged him to start exercising and try to reduce weight.  Avoid fatty foods, foods with high fructose corn syrup. - He gets blood work regularly by Mar 2024.  I have encouraged him to add B12 and magnesium to the next routine blood draw.  He was given written instructions.  Explained long-term side effects of PPIs.   HPI:    Shawn Fox is a 64 y.o. male  For follow-up visit Here for medication refill-Protonix.  Denies having any heartburn, postprandial cough, nausea, vomiting.  No dysphagia. Overall feels much better.  Was quite concerned about family history of colon cancer and would like to get repeat colonoscopy performed in 3 years at the time of EGD for follow-up for short segment Barrett's.  He also has history of sleep apnea but has not been able to use CPAP.  Has gained 7 pounds over last 1 year.  Wt Readings from Last 3 Encounters:  05/03/20 263 lb 6 oz (119.5 kg)  06/22/19 259 lb (117.5 kg)  06/08/19 256 lb (116.1 kg)   Was found to have heme positive stools but had some rectal bleeding when he would wipe.  He had normal CBC.  He also had negative colonoscopy as below.  Past GI procedures: EGD 05/30/2018 - Z-line irregular, 40 cm from the incisors. Bx-Barrett's esophagus. Neg Bx for EoE - Mild gastritis. Biopsied. Neg HP - A single gastric polyp. Resected and retrieved. Bx-fundic gland polyps Heme pos stools, had hoids.  Colonoscopy 06/22/2019 (FH-dad>60) -Mild pancolonic diverticulosis predominantly in the sigmoid colon. -Non-bleeding internal hemorrhoids. -Otherwise  normal colonoscopy.  SH: Wife - RN Unfortunately his mother passed away-had dementia and had hypothermia when she accidentally locked herself out Past Medical History:  Diagnosis Date  . Anxiety   . Arthritis   . Bronchitis, simple, chronic (HCC)    has after flu or colds.  Had flu 1 month ago.  . Carpal tunnel syndrome   . Complication of anesthesia    vomiting  . Cough    recent-  . Depression   . DJD (degenerative joint disease)   . Fatty liver   . GERD (gastroesophageal reflux disease)   . Hyperlipidemia   . Hypertension   . IBS (irritable bowel syndrome)   . Kidney stone   . Pneumonia    had viral pneumonia 11/12-better  . PONV (postoperative nausea and vomiting)   . Sleep apnea    used to have it-lost wt-does not have cpap anymore    Past Surgical History:  Procedure Laterality Date  . CARPAL TUNNEL RELEASE     both  . CHOLECYSTECTOMY    . COLONOSCOPY  05/13/2016   Diverticulosis without bleeding pancolonic diverticulosis more prominent in sigmoid colon internal hemorrhoids second degree  . CYSTOSCOPY W/ STONE MANIPULATION    . ESOPHAGOGASTRODUODENOSCOPY  04/27/2005   normal esophagus moderate gastritis mainly involing the antrum in form of streaky erythemav duodenum normal  . 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 KNEE ARTHROSCOPY     left 3 x  . KNEE ARTHROSCOPY  rt x2  . MASS EXCISION  04/27/2011   Procedure: EXCISION MASS;  Surgeon: Nestor Lewandowsky;  Location: [facility];  Service: Orthopedics;  Laterality: Right;  RT 4TH FINGER GANGLION CYST EXCISION  . UPPER GASTROINTESTINAL ENDOSCOPY      Family History  Problem Relation Age of Onset  . Colon cancer Father   . Rectal cancer Father   . Colon polyps Father   . Stomach cancer Father   . Esophageal cancer Paternal Grandfather   . Other Daughter        tumor removed from stomach    Social History   Tobacco Use  . Smoking status: Never Smoker  . Smokeless tobacco:  Never Used  Vaping Use  . Vaping Use: Never used  Substance Use Topics  . Alcohol use: Not Currently    Comment: last one 3 years   . Drug use: No    Current Outpatient Medications  Medication Sig Dispense Refill  . B Complex Vitamins (VITAMIN B COMPLEX PO) Take 1 tablet by mouth daily.    . divalproex (DEPAKOTE ER) 500 MG 24 hr tablet SMARTSIG:1 Tablet(s) By Mouth Every 12 Hours    . gabapentin (NEURONTIN) 300 MG capsule Take 1 capsule by mouth daily.    Marland Kitchen ipratropium (ATROVENT) 0.06 % nasal spray SMARTSIG:2 Spray(s) Both Nares Twice Daily PRN    . lisinopril (ZESTRIL) 20 MG tablet Take 20 mg by mouth daily.    . Multiple Vitamins-Minerals (CENTRUM SILVER 50+MEN PO) daily.    . pantoprazole (PROTONIX) 40 MG tablet TAKE 1 TABLET BY MOUTH TWICE A DAY 60 tablet 4  . rosuvastatin (CRESTOR) 10 MG tablet rosuvastatin 10 mg tablet  Take 1 tablet every day by oral route.    . RYBELSUS 7 MG TABS 1 tablet daily.    . TRINTELLIX 10 MG TABS tablet Take 10 mg by mouth daily.    Marland Kitchen VITAMIN D, CHOLECALCIFEROL, PO Take 1 tablet by mouth daily.     No current facility-administered medications for this visit.    Allergies  Allergen Reactions  . Dapagliflozin     Yeast infection   . Demerol Nausea And Vomiting    Review of Systems:  neg     Physical Exam:    BP 124/64   Pulse 76   Ht 5\' 9"  (1.753 m)   Wt 263 lb 6 oz (119.5 kg)   BMI 38.89 kg/m  Filed Weights   05/03/20 0826  Weight: 263 lb 6 oz (119.5 kg)   Constitutional:  Well-developed, in no acute distress. Psychiatric: Normal mood and affect. Behavior is normal. HEENT: Pupils normal.  Conjunctivae are normal. No scleral icterus.  Normal throat. Cardiovascular: Normal rate, regular rhythm. No edema Pulmonary/chest: Effort normal and breath sounds normal. No wheezing, rales or rhonchi. Abdominal: Soft, nondistended. Nontender. Bowel sounds active throughout. There are no masses palpable. No hepatomegaly. Rectal:   defered Neurological: Alert and oriented to person place and time. Skin: Skin is warm and dry. No rashes noted.   05/05/20, MD 05/03/2020, 8:52 AM  Cc: Pomposini, 05/05/2020, MD

## 2020-07-11 IMAGING — CT CT MAXILLOFACIAL W/O CM
3 of 4 series · 15 of 47 positions shown, 18 images · non-contrast
Comparison: None.

CLINICAL DATA: Fusion.

EXAM:
CT MAXILLOFACIAL WITHOUT CONTRAST
TECHNIQUE: Multidetector CT images of the paranasal sinuses were obtained using
the standard protocol without intravenous contrast.

[Series 2: standard · axial · 0.38mm/px · z∈[-366,-250]mm · 9 of 136 slices shown, 12 images]
[im 10/136  brain]
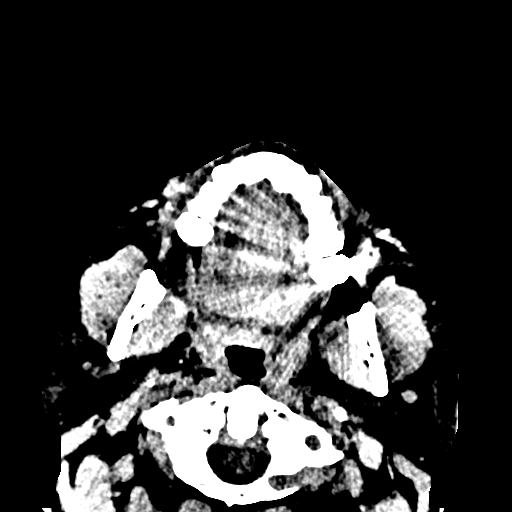
[im 10/136  bone]
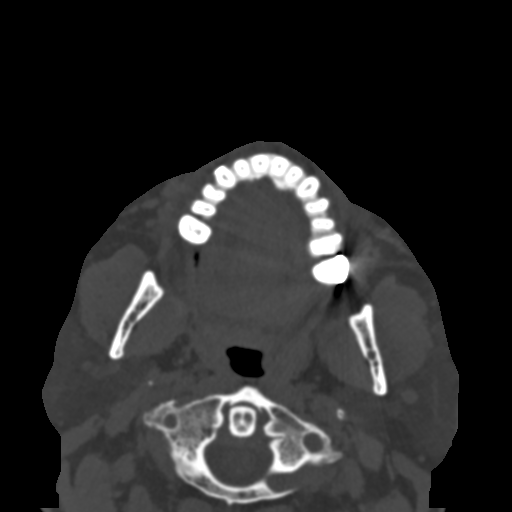
[im 24/136  bone]
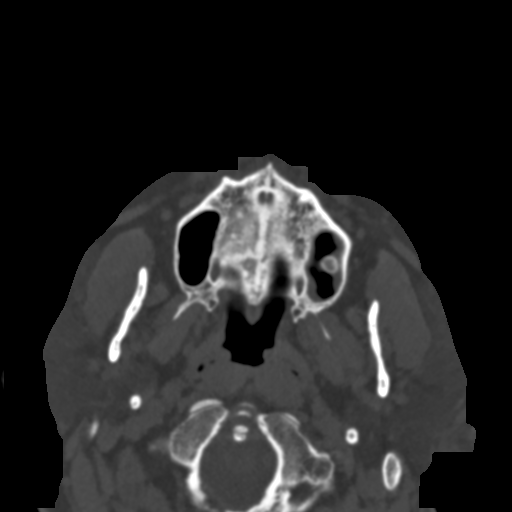
[im 38/136  bone]
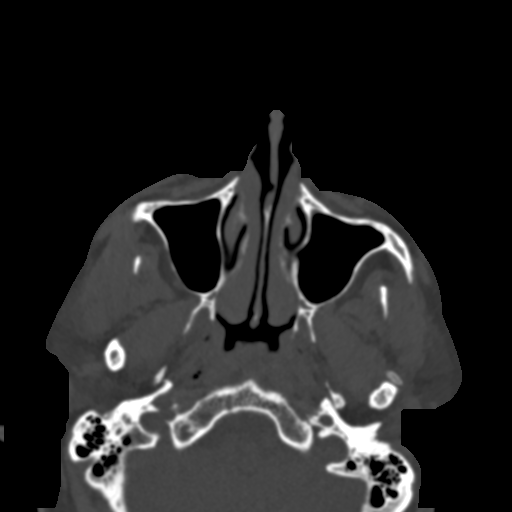
[im 52/136  bone]
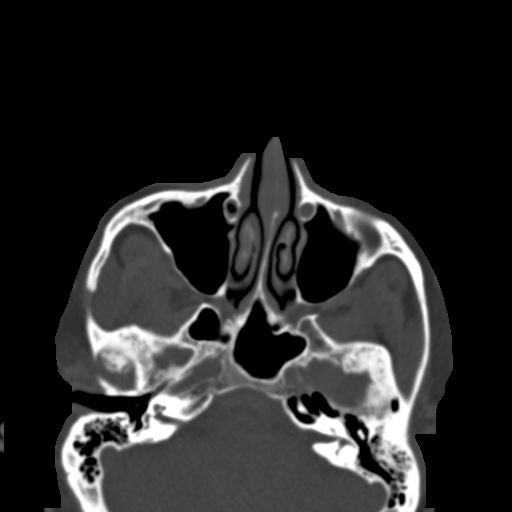
[im 70/136  brain]
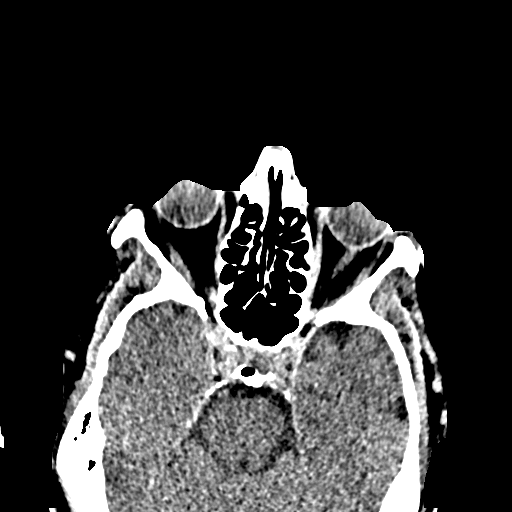
[im 70/136  bone]
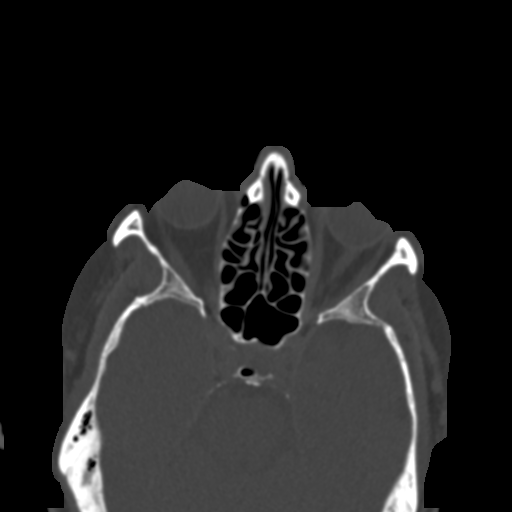
[im 84/136  bone]
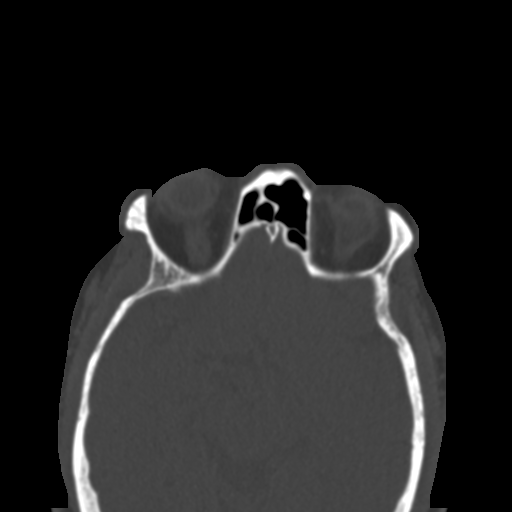
[im 98/136  bone]
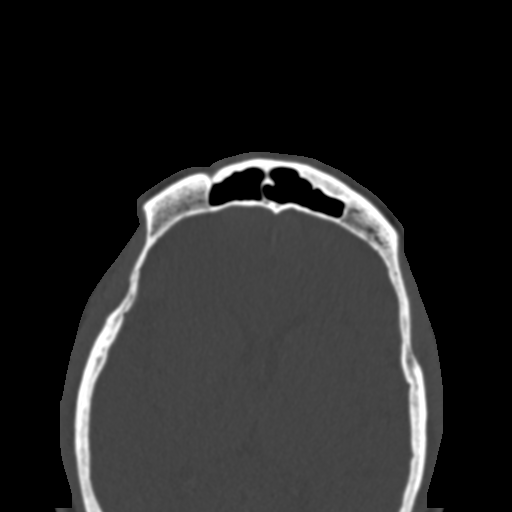
[im 112/136  bone]
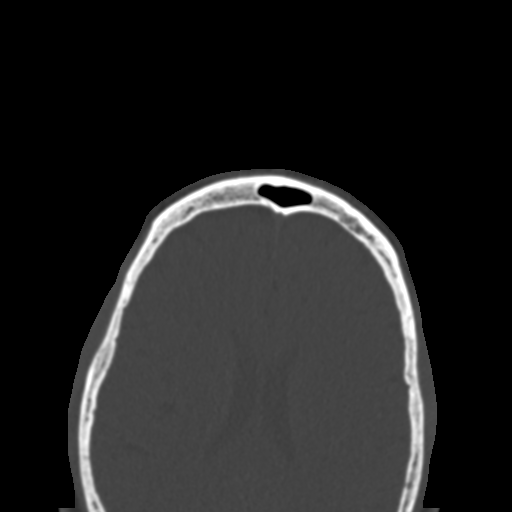
[im 126/136  brain]
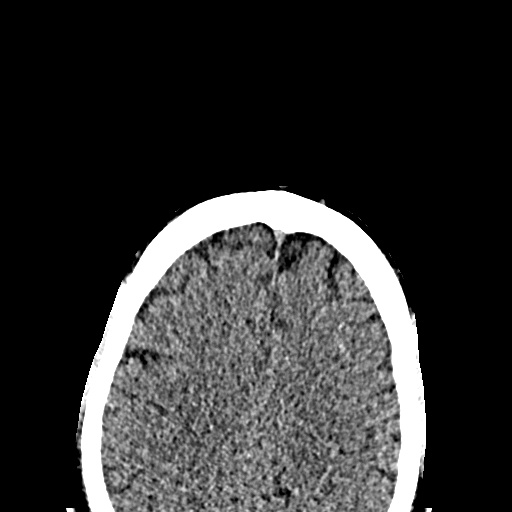
[im 126/136  bone]
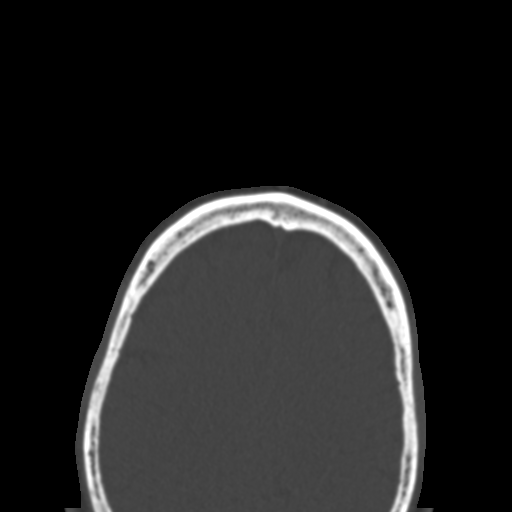

[Series 4: coronal · coronal · 0.28mm/px · 3 of 72 slices shown]
[im 18/72  bone]
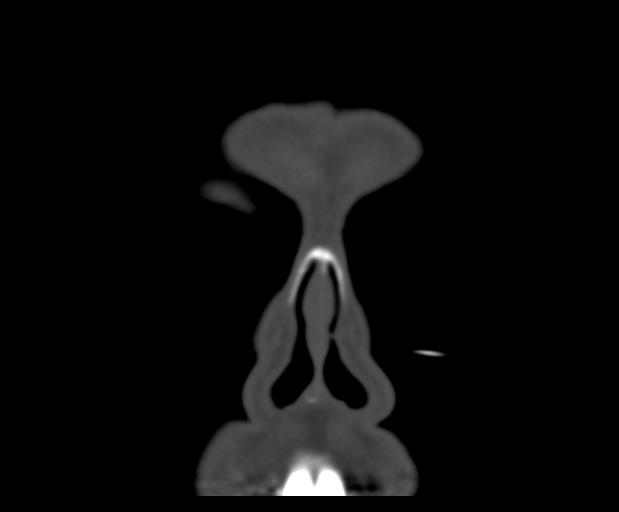
[im 36/72  bone]
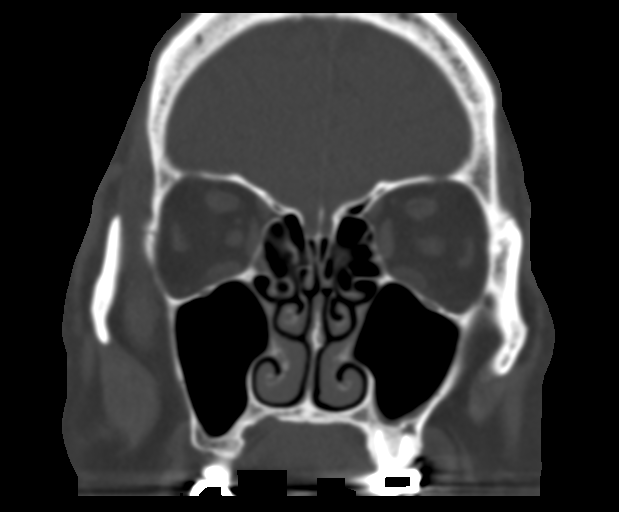
[im 54/72  bone]
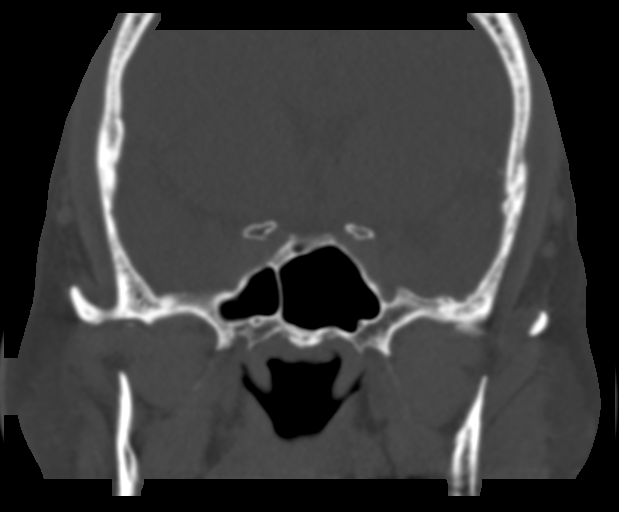

[Series 5: sagittal · sagittal · 0.28mm/px · 3 of 101 slices shown]
[im 34/101  bone]
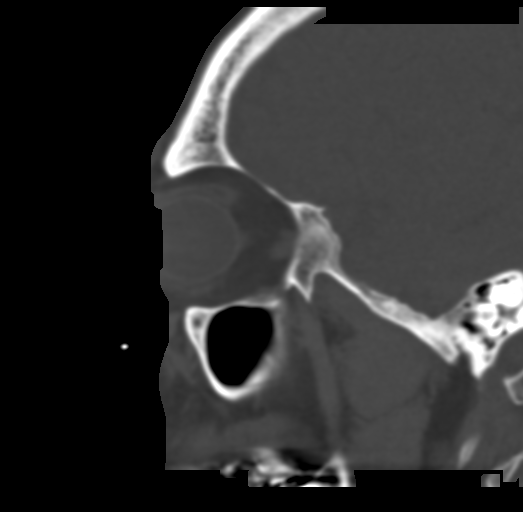
[im 51/101  bone]
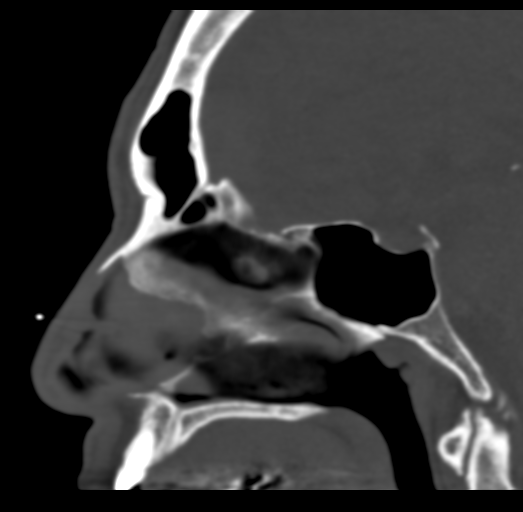
[im 67/101  bone]
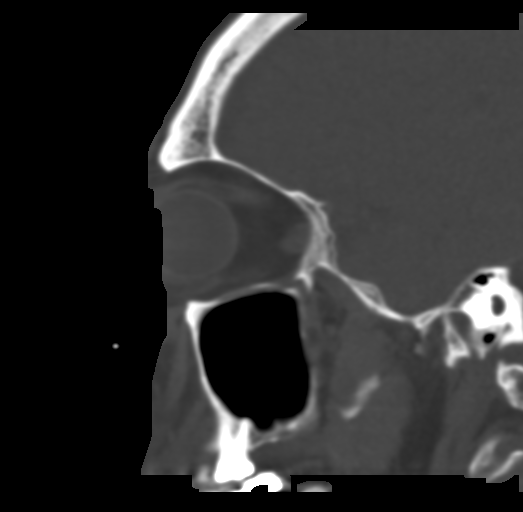

[15 of 47 positions shown; findings below may reference images not displayed]

FINDINGS: Paranasal sinuses:

Frontal: Normally aerated. Patent frontal sinus drainage pathways.

Ethmoid: Mild mucosal thickening is noted bilaterally, predominantly
in the anterior ethmoid cells.

Maxillary: Mild mucosal thickening is noted in the left maxillary
sinus. Infundibular recess is patent.

Sphenoid: Mild mucosal thickening at the level of the bilateral
sphenoethmoidal recesses. The sphenoid sinuses are normally aerated.

Right ostiomeatal unit: Patent.

Left ostiomeatal unit: Patent.

Nasal passages: Patent. Intact nasal septum is midline.

Anatomy: No pneumatization superior to anterior ethmoid notches.
Symmetric and intact olfactory grooves and fovea ethmoidalis, Keros
II (4-7mm) Sellar sphenoid pneumatization pattern. No dehiscence of
carotid or optic canals. No onodi cell.

Other: Orbits and intracranial compartment are grossly unremarkable.
Visible mastoid air cells are normally aerated. Prominent
degenerative changes of the bilateral temporomandibular joints are
seen with intra-articular calcifications noted on the left side.
Periapical lucency is noted at the left maxillary first molar tooth.
Dental decay noted the second right maxillary premolar tooth.
IMPRESSION: 1. Mild mucosal thickening in the left maxillary and bilateral
ethmoid sinuses. No air-fluid levels.
2. Prominent degenerative changes of the bilateral temporomandibular
joints with intra-articular calcifications on the left.
3. Dental decay.

## 2021-03-07 ENCOUNTER — Telehealth: Payer: Self-pay | Admitting: Gastroenterology

## 2021-03-07 DIAGNOSIS — R059 Cough, unspecified: Secondary | ICD-10-CM

## 2021-03-07 MED ORDER — PANTOPRAZOLE SODIUM 40 MG PO TBEC: 40.0000 mg | DELAYED_RELEASE_TABLET | Freq: Every morning | ORAL | 0 refills | Status: DC

## 2021-03-07 NOTE — Telephone Encounter (Signed)
Inbound call from patient requesting medication refill for pantoprazole. 90 day supply sent to  [facility]  [address]  [place], [place] [address] [phone]  This is his new pharmacy

## 2021-03-10 NOTE — Telephone Encounter (Signed)
Patient called requesting we resend the Pantoprazole to the correct pharmacy that is listed below for 90 day supply.

## 2021-03-11 NOTE — Telephone Encounter (Signed)
Rx sent to the correct pharmacy on 03/07/2021.

## 2021-03-12 NOTE — Telephone Encounter (Signed)
Sp[facility] with [facility] [address] [facility] [place], he stated that the rx was there. There is no [address] [facility]. He stated no matter which [facility] the patient went to it could be filled. He had a refill [facility] on 10/18 and it is to soon for his refill sent in on 10/21.

## 2021-03-12 NOTE — Telephone Encounter (Signed)
Left patient a message to contact the office. NO [facility] at [address], only at the one [address].

## 2021-03-12 NOTE — Telephone Encounter (Signed)
Patient just called.  [facility] still has not received the prescription for his refill.  Please send again and make sure it went to:  [facility]  [address]  [place], [place] [address] [phone]  For a 90-day supply.  Thank you.

## 2021-03-14 ENCOUNTER — Other Ambulatory Visit: Payer: Self-pay | Admitting: General Surgery

## 2021-03-14 DIAGNOSIS — R059 Cough, unspecified: Secondary | ICD-10-CM

## 2021-03-14 MED ORDER — PANTOPRAZOLE SODIUM 40 MG PO TBEC
40.0000 mg | DELAYED_RELEASE_TABLET | Freq: Every morning | ORAL | 0 refills | Status: DC
Start: 2021-03-14 — End: 2021-06-24

## 2021-06-23 ENCOUNTER — Other Ambulatory Visit: Payer: Self-pay | Admitting: Gastroenterology

## 2021-06-23 DIAGNOSIS — R059 Cough, unspecified: Secondary | ICD-10-CM

## 2021-07-16 ENCOUNTER — Encounter: Payer: Self-pay | Admitting: Gastroenterology

## 2021-08-15 ENCOUNTER — Encounter: Payer: Self-pay | Admitting: Gastroenterology

## 2021-09-09 ENCOUNTER — Ambulatory Visit (AMBULATORY_SURGERY_CENTER): Payer: BC Managed Care – PPO | Admitting: *Deleted

## 2021-09-09 VITALS — Ht 69.5 in | Wt 267.0 lb

## 2021-09-09 DIAGNOSIS — K2270 Barrett's esophagus without dysplasia: Secondary | ICD-10-CM

## 2021-09-09 NOTE — Progress Notes (Signed)
No egg or soy allergy known to patient  ? issues known to pt with past sedation with any surgeries or procedures PONV  ?Patient denies ever being told they had issues or difficulty with intubation  ?No FH of Malignant Hyperthermia ?Pt is not on diet pills ?Pt is not on  home 02  ?Pt is not on blood thinners  ?Pt denies issues with constipation  ?No A fib or A flutter ? ?PV completed over the phone. Pt verified name, DOB, address and insurance during PV today.  ?Pt mailed instruction packet with copy of consent form to read and not return, and instructions.  ?Pt encouraged to call with questions or issues.  ?If pt has My chart, procedure instructions sent via My Chart  ?Insurance confirmed with pt at [facility] today  ? ?

## 2021-09-21 ENCOUNTER — Other Ambulatory Visit: Payer: Self-pay | Admitting: Gastroenterology

## 2021-09-21 DIAGNOSIS — R059 Cough, unspecified: Secondary | ICD-10-CM

## 2021-09-30 ENCOUNTER — Encounter: Payer: BC Managed Care – PPO | Admitting: Gastroenterology

## 2021-10-15 ENCOUNTER — Encounter: Payer: Self-pay | Admitting: Gastroenterology

## 2021-10-17 ENCOUNTER — Encounter: Payer: Self-pay | Admitting: Gastroenterology

## 2021-10-17 ENCOUNTER — Ambulatory Visit (AMBULATORY_SURGERY_CENTER): Payer: BC Managed Care – PPO | Admitting: Gastroenterology

## 2021-10-17 VITALS — BP 128/62 | HR 70 | Temp 98.4°F | Resp 14 | Ht 69.0 in | Wt 267.0 lb

## 2021-10-17 DIAGNOSIS — K2270 Barrett's esophagus without dysplasia: Secondary | ICD-10-CM | POA: Diagnosis not present

## 2021-10-17 DIAGNOSIS — K2970 Gastritis, unspecified, without bleeding: Secondary | ICD-10-CM | POA: Diagnosis not present

## 2021-10-17 DIAGNOSIS — K319 Disease of stomach and duodenum, unspecified: Secondary | ICD-10-CM | POA: Diagnosis not present

## 2021-10-17 MED ORDER — SODIUM CHLORIDE 0.9 % IV SOLN: 500.0000 mL | Freq: Once | INTRAVENOUS | Status: DC

## 2021-10-17 NOTE — Progress Notes (Signed)
Chief Complaint: GERD  Referring Provider:  Pomposini, Rande Brunt, MD      ASSESSMENT AND PLAN;   #1. 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 #2. Int hoids. Nl CBC #3. FH of colon cancer (dad at age 64).  Negative colon 06/2019 except for pancolonic diverticulosis, internal hemorrhoids.  Next due 06/2022  Plan: - Protonix 40mg  po qAM #90, 4 refills. - EGD/colon Jan/Feb 2024  - Encouraged him to start exercising and try to reduce weight.  Avoid fatty foods, foods with high fructose corn syrup. - He gets blood work regularly by Mar 2024.  I have encouraged him to add B12 and magnesium to the next routine blood draw.  He was given written instructions.  Explained long-term side effects of PPIs.   HPI:    Shawn Fox is a 66 y.o. male  For follow-up visit Here for medication refill-Protonix.  Denies having any heartburn, postprandial cough, nausea, vomiting.  No dysphagia. Overall feels much better.  Was quite concerned about family history of colon cancer and would like to get repeat colonoscopy performed in 3 years at the time of EGD for follow-up for short segment Barrett's.  He also has history of sleep apnea but has not been able to use CPAP.  Has gained 7 pounds over last 1 year.  Wt Readings from Last 3 Encounters:  10/17/21 267 lb (121.1 kg)  09/09/21 267 lb (121.1 kg)  05/03/20 263 lb 6 oz (119.5 kg)   Was found to have heme positive stools but had some rectal bleeding when he would wipe.  He had normal CBC.  He also had negative colonoscopy as below.  Past GI procedures: EGD 05/30/2018 - Z-line irregular, 40 cm from the incisors. Bx-Barrett's esophagus. Neg Bx for EoE - Mild gastritis. Biopsied. Neg HP - A single gastric polyp. Resected and retrieved. Bx-fundic gland polyps Heme pos stools, had hoids.  Colonoscopy 06/22/2019 (FH-dad>60) -Mild pancolonic diverticulosis predominantly in the sigmoid colon. -Non-bleeding internal hemorrhoids. -Otherwise  normal colonoscopy.  SH: Wife - RN Unfortunately his mother passed away-had dementia and had hypothermia when she accidentally locked herself out Past Medical History:  Diagnosis Date   Anxiety    Arthritis    Asthma    Barrett's esophagus    Bronchitis, simple, chronic (HCC)    has after flu or colds.  Had flu 1 month ago.   Carpal tunnel syndrome    Complication of anesthesia    vomiting   Cough    recent-   Depression    Diabetes mellitus without complication (HCC)    DJD (degenerative joint disease)    Fatty liver    GERD (gastroesophageal reflux disease)    Hyperlipidemia    Hypertension    IBS (irritable bowel syndrome)    Kidney stone    Pneumonia    had viral pneumonia 11/12-better   PONV (postoperative nausea and vomiting)    Sleep apnea    wears cpap    Past Surgical History:  Procedure Laterality Date   CARPAL TUNNEL RELEASE Bilateral    both   CHOLECYSTECTOMY     COLONOSCOPY  05/13/2016   Diverticulosis without bleeding pancolonic diverticulosis more prominent in sigmoid colon internal hemorrhoids second degree   CYSTOSCOPY W/ STONE MANIPULATION     ESOPHAGOGASTRODUODENOSCOPY  04/27/2005   normal esophagus moderate gastritis mainly involing the antrum in form of streaky erythemav duodenum normal   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   KNEE ARTHROSCOPY  left 3 x   KNEE ARTHROSCOPY     rt x2   MASS EXCISION  04/27/2011   Procedure: EXCISION MASS;  Surgeon: Nestor LewandowskyFrank J Rowan;  Location: [facility];  Service: Orthopedics;  Laterality: Right;  RT 4TH FINGER GANGLION CYST EXCISION   SHOULDER SURGERY Left 2012   bone spur removed   UPPER GASTROINTESTINAL ENDOSCOPY      Family History  Problem Relation Age of Onset   Colon cancer Father    Rectal cancer Father    Colon polyps Father    Stomach cancer Father    Esophageal cancer Paternal Grandfather    Other Daughter        tumor removed from stomach    Social History    Tobacco Use   Smoking status: Never   Smokeless tobacco: Never  Vaping Use   Vaping Use: Never used  Substance Use Topics   Alcohol use: Not Currently    Comment: last one 3 years    Drug use: No    Current Outpatient Medications  Medication Sig Dispense Refill   cyanocobalamin 100 MCG tablet Take by mouth.     divalproex (DEPAKOTE ER) 500 MG 24 hr tablet SMARTSIG:1 Tablet(s) By Mouth Every 12 Hours     glimepiride (AMARYL) 2 MG tablet Take 2 mg by mouth 2 (two) times daily.     Multiple Vitamins-Minerals (CENTRUM SILVER 50+MEN PO) daily.     olmesartan (BENICAR) 20 MG tablet Take 20 mg by mouth daily.     pantoprazole (PROTONIX) 40 MG tablet TAKE 1 TABLET(40 MG) BY MOUTH IN THE MORNING 90 tablet 0   rosuvastatin (CRESTOR) 10 MG tablet Take by mouth.     RYBELSUS 7 MG TABS 1 tablet daily.     TRINTELLIX 10 MG TABS tablet Take 10 mg by mouth daily.     VITAMIN D, CHOLECALCIFEROL, PO Take 1 tablet by mouth daily.     zolpidem (AMBIEN) 5 MG tablet Take 5 mg by mouth at bedtime as needed.     meloxicam (MOBIC) 15 MG tablet Take 15 mg by mouth daily. (Patient not taking: Reported on 09/09/2021)     Current Facility-Administered Medications  Medication Dose Route Frequency Provider Last Rate Last Admin   0.9 %  sodium chloride infusion  500 mL Intravenous Once Lynann BolognaGupta, Sarahlynn Cisnero, MD        Allergies  Allergen Reactions   Dapagliflozin     Yeast infection    Demerol Nausea And Vomiting   Lisinopril Cough   Metformin Diarrhea    Review of Systems:  neg     Physical Exam:    BP (!) 111/50   Pulse 76   Temp 98.4 F (36.9 C) (Skin)   Ht 5\' 9"  (1.753 m)   Wt 267 lb (121.1 kg)   SpO2 94%   BMI 39.43 kg/m  Filed Weights   10/17/21 0907  Weight: 267 lb (121.1 kg)   Constitutional:  Well-developed, in no acute distress. Psychiatric: Normal mood and affect. Behavior is normal. HEENT: Pupils normal.  Conjunctivae are normal. No scleral icterus.  Normal  throat. Cardiovascular: Normal rate, regular rhythm. No edema Pulmonary/chest: Effort normal and breath sounds normal. No wheezing, rales or rhonchi. Abdominal: Soft, nondistended. Nontender. Bowel sounds active throughout. There are no masses palpable. No hepatomegaly. Rectal:  defered Neurological: Alert and oriented to person place and time. Skin: Skin is warm and dry. No rashes noted.   Edman Circleaj Tyiesha Brackney, MD 10/17/2021, 9:46 AM  Cc: Pomposini, Rande Brunt, MD

## 2021-10-17 NOTE — Progress Notes (Signed)
Pt's states no medical or surgical changes since previsit or office visit. 

## 2021-10-17 NOTE — Progress Notes (Signed)
Called to room to assist during endoscopic procedure.  Patient ID and intended procedure confirmed with present staff. Received instructions for my participation in the procedure from the performing physician.  

## 2021-10-17 NOTE — Progress Notes (Signed)
Report to PACU, RN, vss, BBS= Clear.  

## 2021-10-17 NOTE — Patient Instructions (Signed)
YOU HAD AN ENDOSCOPIC PROCEDURE TODAY AT THE [facility]:   Refer to the procedure report that was given to you for any specific questions about what was found during the examination.  If the procedure report does not answer your questions, please call your gastroenterologist to clarify.  If you requested that your care partner not be given the details of your procedure findings, then the procedure report has been included in a sealed envelope for you to review at your convenience later.  YOU SHOULD EXPECT: Some feelings of bloating in the abdomen. Passage of more gas than usual.  Walking can help get rid of the air that was put into your GI tract during the procedure and reduce the bloating. If you had a lower endoscopy (such as a colonoscopy or flexible sigmoidoscopy) you may notice spotting of blood in your stool or on the toilet paper. If you underwent a bowel prep for your procedure, you may not have a normal bowel movement for a few days.  Please Note:  You might notice some irritation and congestion in your nose or some drainage.  This is from the oxygen used during your procedure.  There is no need for concern and it should clear up in a day or so.  SYMPTOMS TO REPORT IMMEDIATELY:    Following upper endoscopy (EGD)  Vomiting of blood or coffee ground material  New chest pain or pain under the shoulder blades  Painful or persistently difficult swallowing  New shortness of breath  Fever of 100F or higher  Black, tarry-looking stools  For urgent or emergent issues, a gastroenterologist can be reached at any hour by calling [phone]. Do not use MyChart messaging for urgent concerns.    DIET:  We do recommend a small meal at first, but then you may proceed to your regular diet.  Drink plenty of fluids but you should avoid alcoholic beverages for 24 hours.  ACTIVITY:  You should plan to take it easy for the rest of today and you should NOT DRIVE or use heavy machinery  until tomorrow (because of the sedation medicines used during the test).    FOLLOW UP: Our staff will call the number listed on your records 48-72 hours following your procedure to check on you and address any questions or concerns that you may have regarding the information given to you following your procedure. If we do not reach you, we will leave a message.  We will attempt to reach you two times.  During this call, we will ask if you have developed any symptoms of COVID 19. If you develop any symptoms (ie: fever, flu-like symptoms, shortness of breath, cough etc.) before then, please call [phone].  If you test positive for Covid 19 in the 2 weeks post procedure, please call and report this information to us.    If any biopsies were taken you will be contacted by phone or by letter within the next 1-3 weeks.  Please call us at [phone] if you have not heard about the biopsies in 3 weeks.    SIGNATURES/CONFIDENTIALITY: You and/or your care partner have signed paperwork which will be entered into your electronic medical record.  These signatures attest to the fact that that the information above on your After Visit Summary has been reviewed and is understood.  Full responsibility of the confidentiality of this discharge information lies with you and/or your care-partner. 

## 2021-10-17 NOTE — Op Note (Signed)
[facility] Patient Name: Shawn Fox Procedure Date: 10/17/2021 9:42 AM MRN: [id number] Endoscopist: Lynann Bolognaajesh Nekita Pita , MD Age: [age] Referring MD:  Date of Birth: 1956/02/01 Gender: Male Account #: [id number] Procedure:                Upper GI endoscopy Indications:              H/O Barrett's esophagus Medicines:                Monitored Anesthesia Care Procedure:                Pre-Anesthesia Assessment:                           - Prior to the procedure, a History and Physical                            was performed, and patient medications and                            allergies were reviewed. The patient's tolerance of                            previous anesthesia was also reviewed. The risks                            and benefits of the procedure and the sedation                            options and risks were discussed with the patient.                            All questions were answered, and informed consent                            was obtained. Prior Anticoagulants: The patient has                            taken no previous anticoagulant or antiplatelet                            agents. ASA Grade Assessment: II - A patient with                            mild systemic disease. After reviewing the risks                            and benefits, the patient was deemed in                            satisfactory condition to undergo the procedure.                           After obtaining informed consent, the endoscope was  passed under direct vision. Throughout the                            procedure, the patient's blood pressure, pulse, and                            oxygen saturations were monitored continuously. The                            GIF [id number] #[id number] was introduced through the                            mouth, and advanced to the second part of duodenum.                            The upper GI endoscopy was  accomplished without                            difficulty. The patient tolerated the procedure                            well. Scope In: Scope Out: Findings:                 The Z-line was irregular and was found 40 cm from                            the incisors with small tonguelike projections                            extending 1 cm above the GE junction. Biopsies were                            taken with a cold forceps for histology, from all 4                            quadrants directed by NBI.                           Localized mild inflammation characterized by                            erythema was found in the gastric antrum. Biopsies                            were taken with a cold forceps for histology. Few                            small polyps measuring 2 to 4 mm were noted in the                            body of the stomach (not removed since previously  deemed to be hyperplastic)                           The examined duodenum was normal. Complications:            No immediate complications. Estimated Blood Loss:     Estimated blood loss: none. Impression:               - Short segment Barrett's esophagus - biopsied.                           - Gastritis. Biopsied.                           - Normal examined duodenum. Recommendation:           - Patient has a contact number available for                            emergencies. The signs and symptoms of potential                            delayed complications were discussed with the                            patient. Return to normal activities tomorrow.                            Written discharge instructions were provided to the                            patient.                           - Resume previous diet.                           - Continue present medications.                           - Await pathology results.                           - The findings and  recommendations were discussed                            with the patient's wife Misty Stanley. Lynann Bologna, MD 10/17/2021 10:05:05 AM This report has been signed electronically.

## 2021-10-20 ENCOUNTER — Telehealth: Payer: Self-pay | Admitting: *Deleted

## 2021-10-20 NOTE — Telephone Encounter (Signed)
Attempted to call patient for their post-procedure follow-up call. No answer. Left voicemail.   

## 2021-10-20 NOTE — Telephone Encounter (Signed)
  Follow up Call-     10/17/2021    9:07 AM 06/22/2019    8:04 AM  Call back number  Post procedure Call Back phone  # [phone]  Permission to leave phone message Yes Yes     Patient questions:  Do you have a fever, pain , or abdominal swelling? No. Pain Score  0 *  Have you tolerated food without any problems? Yes.    Have you been able to return to your normal activities? Yes.    Do you have any questions about your discharge instructions: Diet   No. Medications  No. Follow up visit  No.  Do you have questions or concerns about your Care? No.  Actions: * If pain score is 4 or above: No action needed, pain <4.

## 2021-10-26 ENCOUNTER — Encounter: Payer: Self-pay | Admitting: Gastroenterology

## 2021-12-18 ENCOUNTER — Other Ambulatory Visit: Payer: Self-pay | Admitting: Gastroenterology

## 2021-12-18 DIAGNOSIS — R059 Cough, unspecified: Secondary | ICD-10-CM

## 2022-08-05 ENCOUNTER — Other Ambulatory Visit: Payer: Self-pay

## 2022-08-05 DIAGNOSIS — R059 Cough, unspecified: Secondary | ICD-10-CM

## 2022-08-05 MED ORDER — PANTOPRAZOLE SODIUM 40 MG PO TBEC: DELAYED_RELEASE_TABLET | ORAL | 4 refills | Status: DC

## 2022-09-02 LAB — BASIC METABOLIC PANEL
BUN: 27 — AB (ref 4–21)
Creatinine: 0.9 (ref 0.6–1.3)
Glucose: 188

## 2022-09-02 LAB — HEMOGLOBIN A1C: Hemoglobin A1C: 8.3

## 2022-09-02 LAB — COMPREHENSIVE METABOLIC PANEL: eGFR: 92

## 2022-12-29 LAB — COMPREHENSIVE METABOLIC PANEL: eGFR: 95

## 2022-12-29 LAB — HEMOGLOBIN A1C: Hemoglobin A1C: 8

## 2022-12-29 LAB — BASIC METABOLIC PANEL
BUN: 16 (ref 4–21)
Creatinine: 0.9 (ref 0.6–1.3)
Glucose: 125

## 2023-04-09 NOTE — Patient Instructions (Signed)

## 2023-04-12 ENCOUNTER — Encounter: Payer: Self-pay | Admitting: Nurse Practitioner

## 2023-04-12 ENCOUNTER — Ambulatory Visit: Payer: Medicare HMO | Admitting: Nurse Practitioner

## 2023-04-12 VITALS — BP 126/81 | HR 73 | Ht 69.0 in | Wt 268.2 lb

## 2023-04-12 DIAGNOSIS — E782 Mixed hyperlipidemia: Secondary | ICD-10-CM

## 2023-04-12 DIAGNOSIS — E559 Vitamin D deficiency, unspecified: Secondary | ICD-10-CM

## 2023-04-12 DIAGNOSIS — E119 Type 2 diabetes mellitus without complications: Secondary | ICD-10-CM

## 2023-04-12 DIAGNOSIS — I10 Essential (primary) hypertension: Secondary | ICD-10-CM

## 2023-04-12 DIAGNOSIS — Z794 Long term (current) use of insulin: Secondary | ICD-10-CM | POA: Diagnosis not present

## 2023-04-12 LAB — POCT GLYCOSYLATED HEMOGLOBIN (HGB A1C): Hemoglobin A1C: 9 % — AB (ref 4.0–5.6)

## 2023-04-12 MED ORDER — DEXCOM G7 SENSOR MISC: 1.0000 | 3 refills | Status: DC

## 2023-04-12 NOTE — Progress Notes (Signed)
Endocrinology Consult Note       04/12/2023, 4:11 PM   Subjective:    Patient ID: Shawn Fox, male    DOB: 11/27/1955.  Shawn Fox is being seen in consultation for management of currently uncontrolled symptomatic diabetes requested by  Arlina Robes, MD.   Past Medical History:  Diagnosis Date   Anxiety    Arthritis    Asthma    Barrett's esophagus    Bronchitis, simple, chronic (HCC)    has after flu or colds.  Had flu 1 month ago.   Carpal tunnel syndrome    Complication of anesthesia    vomiting   Cough    recent-   Depression    Diabetes mellitus without complication (HCC)    DJD (degenerative joint disease)    Fatty liver    GERD (gastroesophageal reflux disease)    Hyperlipidemia    Hypertension    IBS (irritable bowel syndrome)    Kidney stone    Pneumonia    had viral pneumonia 11/12-better   PONV (postoperative nausea and vomiting)    Sleep apnea    wears cpap    Past Surgical History:  Procedure Laterality Date   CARPAL TUNNEL RELEASE Bilateral    both   CHOLECYSTECTOMY     COLONOSCOPY  05/13/2016   Diverticulosis without bleeding pancolonic diverticulosis more prominent in sigmoid colon internal hemorrhoids second degree   CYSTOSCOPY W/ STONE MANIPULATION     ESOPHAGOGASTRODUODENOSCOPY  04/27/2005   normal esophagus moderate gastritis mainly involing the antrum in form of streaky erythemav duodenum normal   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   KNEE ARTHROSCOPY     left 3 x   KNEE ARTHROSCOPY     rt x2   MASS EXCISION  04/27/2011   Procedure: EXCISION MASS;  Surgeon: Nestor Lewandowsky;  Location:  [facility];  Service: Orthopedics;  Laterality: Right;  RT 4TH FINGER GANGLION CYST EXCISION   SHOULDER SURGERY Left 2012   bone spur removed   UPPER GASTROINTESTINAL ENDOSCOPY      Social History   Socioeconomic History   Marital status:  Married    Spouse name: Not on file   Number of children: 2   Years of education: Not on file   Highest education level: Not on file  Occupational History   Not on file  Tobacco Use   Smoking status: Never   Smokeless tobacco: Never  Vaping Use   Vaping status: Never Used  Substance and Sexual Activity   Alcohol use: Not Currently    Comment: last one 3 years    Drug use: No   Sexual activity: Not on file  Other Topics Concern   Not on file  Social History Narrative   Not on file   Social Determinants of Health   Financial Resource Strain: Not on file  Food Insecurity: Not on file  Transportation Needs: Not on file  Physical Activity: Not on file  Stress: Not on file  Social Connections: Not on file    Family History  Problem Relation Age of Onset   Colon cancer Father    Rectal cancer Father  Colon polyps Father    Stomach cancer Father    Esophageal cancer Paternal Grandfather    Other Daughter        tumor removed from stomach    Outpatient Encounter Medications as of 04/12/2023  Medication Sig   Continuous Glucose Sensor (DEXCOM G7 SENSOR) MISC Inject 1 Application into the skin as directed. Change sensor every 10 days as directed.   cyanocobalamin 100 MCG tablet Take by mouth.   divalproex (DEPAKOTE ER) 500 MG 24 hr tablet SMARTSIG:1 Tablet(s) By Mouth Every 12 Hours   divalproex (DEPAKOTE) 500 MG DR tablet Take 500 mg by mouth 2 (two) times daily.   meloxicam (MOBIC) 15 MG tablet Take 15 mg by mouth daily. As needed.   Multiple Vitamins-Minerals (CENTRUM SILVER 50+MEN PO) daily.   olmesartan (BENICAR) 20 MG tablet Take 20 mg by mouth daily.   pantoprazole (PROTONIX) 40 MG tablet TAKE 1 TABLET(40 MG) BY MOUTH IN THE MORNING   rosuvastatin (CRESTOR) 10 MG tablet Take by mouth.   tamsulosin (FLOMAX) 0.4 MG CAPS capsule Take 0.4 mg by mouth daily.   TRESIBA FLEXTOUCH 200 UNIT/ML FlexTouch Pen Inject 80 Units into the skin at bedtime.   venlafaxine XR  (EFFEXOR-XR) 75 MG 24 hr capsule Take 1 capsule by mouth daily.   VITAMIN D, CHOLECALCIFEROL, PO Take 1 tablet by mouth daily.   zolpidem (AMBIEN) 5 MG tablet Take 5 mg by mouth at bedtime as needed.   [DISCONTINUED] glimepiride (AMARYL) 2 MG tablet Take 2 mg by mouth 2 (two) times daily.   [DISCONTINUED] RYBELSUS 7 MG TABS 1 tablet daily.   [DISCONTINUED] TRINTELLIX 10 MG TABS tablet Take 10 mg by mouth daily.   No facility-administered encounter medications on file as of 04/12/2023.    ALLERGIES: Allergies  Allergen Reactions   Dapagliflozin     Yeast infection    Demerol Nausea And Vomiting   Lisinopril Cough   Metformin Diarrhea    VACCINATION STATUS:  There is no immunization history on file for this patient.  Diabetes He presents for his initial diabetic visit. He has type 2 diabetes mellitus. Onset time: Diagnosed at approx age of 66. His disease course has been worsening. There are no hypoglycemic associated symptoms. Associated symptoms include fatigue and polydipsia. There are no hypoglycemic complications. Symptoms are stable. There are no diabetic complications. Risk factors for coronary artery disease include diabetes mellitus, male sex, obesity and hypertension. Current diabetic treatment includes insulin injections. He is compliant with treatment most of the time. His weight is increasing steadily. He is following a generally unhealthy diet. When asked about meal planning, he reported none. He has not had a previous visit with a dietitian. He participates in exercise daily. His breakfast blood glucose range is generally 140-180 mg/dl. His bedtime blood glucose range is generally 180-200 mg/dl. (He presents today for his consultation with his logs showing above target glycemic profile overall.  His POCT A1c today is 9% increasing from last A1c of 8.3%.  He monitors glucose twice daily.  He drinks mostly diet coke or sweet tea, only 2 bottles of water per day, and coffee with  creamer.  He generally eats 2-3 meals per day (sometimes skipping lunch) and will snack on candy.  He notes candy is his weakness.  He maintains an active lifestyle but admits he has slacked off recently due to the colder weather.  He is UTD on eye exam, sees podiatry routinely.) An ACE inhibitor/angiotensin II receptor blocker is being taken.  He sees a podiatrist.Eye exam is current.     Review of systems  Constitutional: + Minimally fluctuating body weight, current Body mass index is 39.61 kg/m., no fatigue, no subjective hyperthermia, no subjective hypothermia Eyes: no blurry vision, no xerophthalmia ENT: no sore throat, no nodules palpated in throat, no dysphagia/odynophagia, no hoarseness Cardiovascular: no chest pain, no shortness of breath, no palpitations, no leg swelling Respiratory: no cough, no shortness of breath Gastrointestinal: no nausea/vomiting/diarrhea Musculoskeletal: no muscle/joint aches Skin: no rashes, no hyperemia Neurological: no tremors, no numbness, no tingling, no dizziness Psychiatric: no depression, no anxiety  Objective:     BP 126/81 (BP Location: Right Arm, Patient Position: Sitting, Cuff Size: Large)   Pulse 73   Ht 5\' 9"  (1.753 m)   Wt 268 lb 3.2 oz (121.7 kg)   BMI 39.61 kg/m   Wt Readings from Last 3 Encounters:  04/12/23 268 lb 3.2 oz (121.7 kg)  10/17/21 267 lb (121.1 kg)  09/09/21 267 lb (121.1 kg)     BP Readings from Last 3 Encounters:  04/12/23 126/81  10/17/21 128/62  05/03/20 124/64     Physical Exam- Limited  Constitutional:  Body mass index is 39.61 kg/m. , not in acute distress, normal state of mind Eyes:  EOMI, no exophthalmos Neck: Supple Cardiovascular: RRR, no murmurs, rubs, or gallops, no edema Respiratory: Adequate breathing efforts, no crackles, rales, rhonchi, or wheezing Musculoskeletal: no gross deformities, strength intact in all four extremities, no gross restriction of joint movements Skin:  no rashes, no  hyperemia Neurological: no tremor with outstretched hands   Diabetic Foot Exam - Simple   No data filed      CMP ( most recent) CMP     Component Value Date/Time   NA 136 08/26/2010 0410   K 4.6 08/26/2010 0410   CL 101 08/26/2010 0410   CO2 30 08/26/2010 0410   GLUCOSE 175 (H) 08/26/2010 0410   BUN 16 12/29/2022 0000   CREATININE 0.9 12/29/2022 0000   CREATININE 1.23 08/26/2010 0410   CALCIUM 8.2 (L) 08/26/2010 0410   EGFR 95 12/29/2022 0000   GFRNONAA >60 08/26/2010 0410     Diabetic Labs (most recent): Lab Results  Component Value Date   HGBA1C 9.0 (A) 04/12/2023   HGBA1C 8 12/29/2022   HGBA1C 8.3 09/02/2022     Lipid Panel ( most recent) Lipid Panel  No results found for: "CHOL", "TRIG", "HDL", "CHOLHDL", "VLDL", "LDLCALC", "LDLDIRECT", "LABVLDL"    No results found for: "TSH", "FREET4"         Assessment & Plan:   1) Type 2 diabetes mellitus without complication, with long-term current use of insulin (HCC)  He presents today for his consultation with his logs showing above target glycemic profile overall.  His POCT A1c today is 9% increasing from last A1c of 8.3%.  He monitors glucose twice daily.  He drinks mostly diet coke or sweet tea, only 2 bottles of water per day, and coffee with creamer.  He generally eats 2-3 meals per day (sometimes skipping lunch) and will snack on candy.  He notes candy is his weakness.  He maintains an active lifestyle but admits he has slacked off recently due to the colder weather.  He is UTD on eye exam, sees podiatry routinely.  - Shawn Fox has currently uncontrolled symptomatic type 2 DM since 67 years of age, with most recent A1c of 9 %.   -Recent labs reviewed.  - I had a long discussion with  him about the progressive nature of diabetes and the pathology behind its complications. -his diabetes is not currently complicated but he remains at a high risk for more acute and chronic complications which include CAD,  CVA, CKD, retinopathy, and neuropathy. These are all discussed in detail with him.  The following Lifestyle Medicine recommendations according to American College of Lifestyle Medicine [facility]) were discussed and offered to patient and he agrees to start the journey:  A. Whole Foods, Plant-based plate comprising of fruits and vegetables, plant-based proteins, whole-grain carbohydrates was discussed in detail with the patient.   A list for source of those nutrients were also provided to the patient.  Patient will use only water or unsweetened tea for hydration. B.  The need to stay away from risky substances including alcohol, smoking; obtaining 7 to 9 hours of restorative sleep, at least 150 minutes of moderate intensity exercise weekly, the importance of healthy social connections,  and stress reduction techniques were discussed. C.  A full color page of  Calorie density of various food groups per pound showing examples of each food groups was provided to the patient.  - I have counseled him on diet and weight management by adopting a carbohydrate restricted/protein rich diet. Patient is encouraged to switch to unprocessed or minimally processed complex starch and increased protein intake (animal or plant source), fruits, and vegetables. -  he is advised to stick to a routine mealtimes to eat 3 meals a day and avoid unnecessary snacks (to snack only to correct hypoglycemia).   - he acknowledges that there is a room for improvement in his food and drink choices. - Suggestion is made for him to avoid simple carbohydrates from his diet including Cakes, Sweet Desserts, Ice Cream, Soda (diet and regular), Sweet Tea, Candies, Chips, Cookies, Store Bought Juices, Alcohol in Excess of 1-2 drinks a day, Artificial Sweeteners, Coffee Creamer, and "Sugar-free" Products. This will help patient to have more stable blood glucose profile and potentially avoid unintended weight gain.  - I have approached him with the  following individualized plan to manage his diabetes and patient agrees:   -He is advised to continue Tresiba 80 units SQ nightly.  He seems somewhat hesitant to change his lifestyle.  -he is encouraged to start/continue monitoring glucose 2 times daily, before breakfast and before bed, to log their readings on the clinic sheets provided, and bring them to review at follow up appointment in 3 months.  I did send in script for Dexcom G7 as I think it will help him choose healthier foods if he can see what happens after eating.  - he is warned not to take insulin without proper monitoring per orders. - Adjustment parameters are given to him for hypo and hyperglycemia in writing. - he is encouraged to call clinic for blood glucose levels less than 70 or above 300 mg /dl.  - he will be considered for incretin therapy as appropriate next visit.  He was on Rybelsus and Ozempic in the past but could not continue to afford it.  I did give him patient assistance form to take and see if he qualifies for financial assistance.  He could certainly benefit from GLP1 therapy.  - Specific targets for  A1c; LDL, HDL, and Triglycerides were discussed with the patient.  2) Blood Pressure /Hypertension:  his blood pressure is controlled to target.   he is advised to continue his current medications including Olmesartan 20 mg p.o. daily with breakfast.  3) Lipids/Hyperlipidemia:  There is no recent lipid panel available to review.  he is advised to continue Crestor 20 mg daily at bedtime.  Side effects and precautions discussed with him.  4)  Weight/Diet:  his Body mass index is 39.61 kg/m.  -  clearly complicating his diabetes care.   he is a candidate for weight loss. I discussed with him the fact that loss of 5 - 10% of his  current body weight will have the most impact on his diabetes management.  Exercise, and detailed carbohydrates information provided  -  detailed on discharge instructions.  5) Chronic  Care/Health Maintenance: -he is on ACEI/ARB and Statin medications and is encouraged to initiate and continue to follow up with Ophthalmology, Dentist, Podiatrist at least yearly or according to recommendations, and advised to stay away from smoking. I have recommended yearly flu vaccine and pneumonia vaccine at least every 5 years; moderate intensity exercise for up to 150 minutes weekly; and sleep for at least 7 hours a day.  - he is advised to maintain close follow up with Arlina Robes, MD for primary care needs, as well as his other providers for optimal and coordinated care.   - Time spent in this patient care: 60 min, of which > 50% was spent in counseling him about his diabetes and the rest reviewing his blood glucose logs, discussing his hypoglycemia and hyperglycemia episodes, reviewing his current and previous labs/studies (including abstraction from other facilities) and medications doses and developing a long term treatment plan based on the latest standards of care/guidelines; and documenting his care.    Please refer to Patient Instructions for Blood Glucose Monitoring and Insulin/Medications Dosing Guide" in media tab for additional information. Please also refer to "Patient Self Inventory" in the Media tab for reviewed elements of pertinent patient history.  Shawn Fox participated in the discussions, expressed understanding, and voiced agreement with the above plans.  All questions were answered to his satisfaction. he is encouraged to contact clinic should he have any questions or concerns prior to his return visit.     Follow up plan: - Return in about 3 months (around 07/13/2023) for Diabetes F/U with A1c in office, Bring meter and logs.    Ronny Bacon, [facility] [address] [place], [place] [address] Phone: [phone] Fax: [phone]  04/12/2023, 4:11 PM

## 2023-04-29 ENCOUNTER — Ambulatory Visit: Payer: Medicare HMO | Admitting: Gastroenterology

## 2023-04-29 ENCOUNTER — Encounter: Payer: Self-pay | Admitting: Gastroenterology

## 2023-04-29 VITALS — BP 118/70 | HR 77 | Ht 69.0 in | Wt 269.0 lb

## 2023-04-29 DIAGNOSIS — K219 Gastro-esophageal reflux disease without esophagitis: Secondary | ICD-10-CM

## 2023-04-29 DIAGNOSIS — Z800 Family history of malignant neoplasm of digestive organs: Secondary | ICD-10-CM

## 2023-04-29 DIAGNOSIS — Z8719 Personal history of other diseases of the digestive system: Secondary | ICD-10-CM

## 2023-04-29 DIAGNOSIS — K2270 Barrett's esophagus without dysplasia: Secondary | ICD-10-CM

## 2023-04-29 DIAGNOSIS — K648 Other hemorrhoids: Secondary | ICD-10-CM | POA: Diagnosis not present

## 2023-04-29 DIAGNOSIS — R109 Unspecified abdominal pain: Secondary | ICD-10-CM

## 2023-04-29 NOTE — Patient Instructions (Addendum)
_______________________________________________________  If your blood pressure at your visit was 140/90 or greater, please contact your primary care physician to follow up on this.  _______________________________________________________  If you are age 65 or older, your body mass index should be between 23-30. Your Body mass index is 39.72 kg/m. If this is out of the aforementioned range listed, please consider follow up with your Primary Care Provider.  If you are age 25 or younger, your body mass index should be between 19-25. Your Body mass index is 39.72 kg/m. If this is out of the aformentioned range listed, please consider follow up with your Primary Care Provider.   ________________________________________________________  The [facility] providers would like to encourage you to use [facility] to communicate with providers for non-urgent requests or questions.  Due to long hold times on the telephone, sending your provider a message by [facility] may be a faster and more efficient way to get a response.  Please allow 48 business hours for a response.  Please remember that this is for non-urgent requests.  _______________________________________________________  It has been recommended to you by your physician that you have a(n) EGD/Colon completed. Per your request, we did not schedule the procedure(s) today. Please contact our office at [phone] in end of December/January to see if we have March schedule.  Can try Protonix every other day  Please start exercising and try to reduce weight. Avoid fatty foods, foods with high fructose corn syrup.    Thank you,  Dr. Lynann Bologna

## 2023-04-29 NOTE — Progress Notes (Signed)
Chief Complaint: FU  Referring Provider:  Eldridge Abrahams, MD      ASSESSMENT AND PLAN;   #1. Abdo pain - likely related to CPAP. R/O other causes. #2. 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 #2. Int hoids. Nl CBC #3. FH of colon cancer (dad at age 65).  Neg colon 06/2019 except for pancolonic div, int hemorrhoids.  Next due 06/2022  Plan: - Protonix 40mg  po qAM #90, 4 refills. Can try every other day. - EGD/colon Feb/march 2025 - Encouraged him to start exercising and try to reduce weight.  Avoid fatty foods, foods with high fructose corn syrup. -If still with abdo pain, consider CT Abdo pelvis with contrast   HPI:    Shawn Fox is a 67 y.o. male  With DM2, OSA on CPAP, anxiety/dep, HLD, HTN, short seg Barretts, neg cardiac stress test @ [place] Sept 2024  For follow-up visit  Discussed the use of AI scribe software for clinical note transcription with the patient, who gave verbal consent to proceed.  History of Present Illness   The patient, with a history of diabetes, Barrett's esophagus, and sleep apnea managed with CPAP, presented with intermittent abdominal pain that has been ongoing for approximately three months. The pain is described as a level five on a scale of one to ten, located around the belly button and extending across the abdomen. The patient noted that the pain seems to be associated with the use of their CPAP machine, as they do not experience the pain on nights when the machine is not used. The patient denied experiencing bloating, nausea, or vomiting in association with the pain.  The patient also reported occasional diarrhea, which they attributed to their diet, particularly the consumption of greasy or spicy foods. They noted that they do not have a gallbladder, which may contribute to this issue. The patient also reported passing a significant amount of gas, particularly in the mornings.  The patient has a history of internal  hemorrhoids, which occasionally cause blood in their stool. They are due for a colonoscopy this year. The patient is also on pantoprazole for reflux associated with their Barrett's esophagus and has been on this medication for at least three years.  The patient has been struggling with weight loss, with their weight fluctuating between losses and gains. They have recently started trying to eat a healthier diet, including more fish, in an attempt to manage their diabetes and lose weight. They have also been prescribed a medication to assist with weight loss, but they reported that it has not been effective.  The patient has a history of depression and suicidal ideation, for which they are managed with Depakote and Effexor. They also take Flomax for prostate issues, which has reduced their frequency of urination from three to four times a night to once or twice.  The patient has been seen by a cardiologist, who conducted a stress test and other imaging studies. The results of these tests were reported to be normal. The patient's blood pressure and pulse have been well-controlled, and their blood sugar levels have been improving.       Wt Readings from Last 3 Encounters:  04/29/23 269 lb (122 kg)  04/12/23 268 lb 3.2 oz (121.7 kg)  10/17/21 267 lb (121.1 kg)     Past GI procedures: EGD 10/17/2021  - Short segment Barrett' s esophagus - biopsied. - Gastritis. Biopsied. - Normal examined duodenum. - Bx: Barrett's.  No dysplasia.   EGD  05/30/2018 - Z-line irregular, 40 cm from the incisors. Bx-Barrett's esophagus. Neg Bx for EoE - Mild gastritis. Biopsied. Neg HP - A single gastric polyp. Resected and retrieved. Bx-fundic gland polyps Heme pos stools, had hoids.  Colonoscopy 06/22/2019 (FH-dad>60) -Mild pancolonic diverticulosis predominantly in the sigmoid colon. -Non-bleeding internal hemorrhoids. -Otherwise normal colonoscopy.  SH: Wife - RN Unfortunately his mother passed away-had  dementia and had hypothermia when she accidentally locked herself out Past Medical History:  Diagnosis Date   Anxiety    Arthritis    Asthma    Barrett's esophagus    Bronchitis, simple, chronic (HCC)    has after flu or colds.  Had flu 1 month ago.   Carpal tunnel syndrome    Complication of anesthesia    vomiting   Cough    recent-   Depression    Diabetes mellitus without complication (HCC)    DJD (degenerative joint disease)    Fatty liver    GERD (gastroesophageal reflux disease)    Hyperlipidemia    Hypertension    IBS (irritable bowel syndrome)    Kidney stone    Pneumonia    had viral pneumonia 11/12-better   PONV (postoperative nausea and vomiting)    Sleep apnea    wears cpap    Past Surgical History:  Procedure Laterality Date   CARPAL TUNNEL RELEASE Bilateral    both   CHOLECYSTECTOMY     COLONOSCOPY  05/13/2016   Diverticulosis without bleeding pancolonic diverticulosis more prominent in sigmoid colon internal hemorrhoids second degree   CYSTOSCOPY W/ STONE MANIPULATION     ESOPHAGOGASTRODUODENOSCOPY  04/27/2005   normal esophagus moderate gastritis mainly involing the antrum in form of streaky erythemav duodenum normal   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   KNEE ARTHROSCOPY     left 3 x   KNEE ARTHROSCOPY     rt x2   MASS EXCISION  04/27/2011   Procedure: EXCISION MASS;  Surgeon: Nestor Lewandowsky;  Location: [facility];  Service: Orthopedics;  Laterality: Right;  RT 4TH FINGER GANGLION CYST EXCISION   SHOULDER SURGERY Left 2012   bone spur removed   UPPER GASTROINTESTINAL ENDOSCOPY      Family History  Problem Relation Age of Onset   Colon cancer Father    Rectal cancer Father    Colon polyps Father    Stomach cancer Father    Esophageal cancer Paternal Grandfather    Other Daughter        tumor removed from stomach    Social History   Tobacco Use   Smoking status: Never   Smokeless tobacco: Never  Vaping Use    Vaping status: Never Used  Substance Use Topics   Alcohol use: Not Currently    Comment: last one 3 years    Drug use: No    Current Outpatient Medications  Medication Sig Dispense Refill   cyanocobalamin 100 MCG tablet Take by mouth.     meloxicam (MOBIC) 15 MG tablet Take 15 mg by mouth daily. As needed.     Multiple Vitamins-Minerals (CENTRUM SILVER 50+MEN PO) daily.     olmesartan (BENICAR) 20 MG tablet Take 20 mg by mouth daily.     pantoprazole (PROTONIX) 40 MG tablet TAKE 1 TABLET(40 MG) BY MOUTH IN THE MORNING 90 tablet 4   rosuvastatin (CRESTOR) 10 MG tablet Take by mouth.     tamsulosin (FLOMAX) 0.4 MG CAPS capsule Take 0.4 mg by mouth daily.  TRESIBA FLEXTOUCH 200 UNIT/ML FlexTouch Pen Inject 80 Units into the skin at bedtime.     venlafaxine XR (EFFEXOR-XR) 75 MG 24 hr capsule Take 1 capsule by mouth daily.     VITAMIN D, CHOLECALCIFEROL, PO Take 1 tablet by mouth daily.     zolpidem (AMBIEN) 5 MG tablet Take 5 mg by mouth at bedtime as needed.     Continuous Glucose Sensor (DEXCOM G7 SENSOR) MISC Inject 1 Application into the skin as directed. Change sensor every 10 days as directed. (Patient not taking: Reported on 04/29/2023) 9 each 3   divalproex (DEPAKOTE ER) 500 MG 24 hr tablet SMARTSIG:1 Tablet(s) By Mouth Every 12 Hours     divalproex (DEPAKOTE) 500 MG DR tablet Take 500 mg by mouth 2 (two) times daily. (Patient not taking: Reported on 04/29/2023)     No current facility-administered medications for this visit.    Allergies  Allergen Reactions   Dapagliflozin     Yeast infection    Demerol Nausea And Vomiting   Lisinopril Cough   Metformin Diarrhea    Review of Systems:  neg     Physical Exam:    BP 118/70   Pulse 77   Ht 5\' 9"  (1.753 m)   Wt 269 lb (122 kg)   BMI 39.72 kg/m  Filed Weights   04/29/23 1421  Weight: 269 lb (122 kg)   Constitutional:  Well-developed, in no acute distress. Psychiatric: Normal mood and affect. Behavior is  normal. HEENT: Pupils normal.  Conjunctivae are normal. No scleral icterus.  Normal throat. Cardiovascular: Normal rate, regular rhythm. No edema Pulmonary/chest: Effort normal and breath sounds normal. No wheezing, rales or rhonchi. Abdominal: Soft, nondistended. Nontender. Bowel sounds active throughout. There are no masses palpable. No hepatomegaly. Rectal:  defered Neurological: Alert and oriented to person place and time. Skin: Skin is warm and dry. No rashes noted.   Edman Circle, MD 04/29/2023, 2:35 PM  Cc: Pomposini, Rande Brunt, MD

## 2023-05-25 MED ORDER — NA SULFATE-K SULFATE-MG SULF 17.5-3.13-1.6 GM/177ML PO SOLN: 1.0000 | Freq: Once | ORAL | 0 refills | Status: AC

## 2023-06-02 ENCOUNTER — Telehealth: Payer: Self-pay | Admitting: *Deleted

## 2023-06-02 NOTE — Telephone Encounter (Signed)
 Yes we can certainly add him back on one of those since he was approved for patient assistance.  My preference is for Ozempic.  May need to have Kim send in updated prescription to PAP.  Will start with Ozempic 0.25 mg SQ weekly x 2 weeks, then increase to 0.5 mg SQ weekly thereafter.

## 2023-06-02 NOTE — Telephone Encounter (Signed)
 Per Laurina Popper will you add the Ozempic since the patient was approved for PAP.

## 2023-06-02 NOTE — Telephone Encounter (Signed)
 Patient called and states that he was approved by [employer]. He thinks that it is for his Insulin. He also states that his PCP had wanted him to be taking the Ozempic and Rybelsus, he would like to know if he is going to be on this also. When I looked at the application, it looks like the Tresiba  was approved. Please advise.

## 2023-06-03 NOTE — Telephone Encounter (Signed)
Faxed medication change form to [facility]

## 2023-06-09 ENCOUNTER — Telehealth: Payer: Self-pay | Admitting: Nurse Practitioner

## 2023-06-09 NOTE — Telephone Encounter (Signed)
Noted  

## 2023-06-09 NOTE — Telephone Encounter (Signed)
FYI Pt was notified that his patient assistance could take 10-14 business days to arrive. He will be out of insulin by the end of next week. I told him it should be here by then but that we would try to get him a sample if not.

## 2023-06-16 ENCOUNTER — Other Ambulatory Visit: Payer: Self-pay | Admitting: Nurse Practitioner

## 2023-06-16 ENCOUNTER — Telehealth: Payer: Self-pay | Admitting: *Deleted

## 2023-06-16 MED ORDER — TRESIBA FLEXTOUCH 200 UNIT/ML ~~LOC~~ SOPN: 80.0000 [IU] | PEN_INJECTOR | Freq: Every day | SUBCUTANEOUS | 3 refills | Status: DC

## 2023-06-16 NOTE — Telephone Encounter (Signed)
I called the automated system and his has not shipped yet. The automated system just states that shipping is going beyond the 14 business day turn around time. I've tried calling several times for other reasons. I have been unable to get anyone on the phone. It will give me a wait time of 45 min and then time increases the longer I hold. I'm assuming its because everyone is re enrolling this month. The Evaristo Bury that was ordered first, its been 13 business days and the Ozempic that was ordered later, it has been 8 business days. So not sure how much longer, it just states beyond the 14 days. I honestly have no idea who the drug reps are, neither do [place] or [place].

## 2023-06-16 NOTE — Telephone Encounter (Signed)
We have no other option but to send in a script to the pharmacy for them to fill while we wait on the meds to ship from [facility].  I sent it to [facility] for him.  I know this situation is not ideal but it is the best we can do for now.  He can try to call his PCP and see if they have any samples too, just in case.

## 2023-06-16 NOTE — Telephone Encounter (Signed)
I have tried to call the patient to make him aware, his voicemail box has not been set up yet.

## 2023-06-16 NOTE — Telephone Encounter (Signed)
Kim, do you have any idea if his medication has been shipped?  Do they do vouchers for those who cannot wait til the meds are shipped?  We can always send in a temporary supply to the pharmacy which I know is not ideal but is a way to keep it in his system.  Who do we need to contact to get more samples?

## 2023-06-16 NOTE — Telephone Encounter (Signed)
Patient has left a message. He will be out of the [place] this weekend. He was approved for the [employer] for the [place]. Patient has been,previously , that it would take up to 14 business days before it may delivered to our office.  We have no samples. Before calling patient back, will address with Whitney for any recommendations.

## 2023-06-17 NOTE — Telephone Encounter (Signed)
Pt is asking for call back from the nurse. He left a VM about his medication. Call his home phone number not his cell

## 2023-06-17 NOTE — Telephone Encounter (Signed)
Spoke with pt and explained to him that the PAP shippments are taking much longer than normal due to Jan re enrollments. He states he did pick up 30 day supply of [place]

## 2023-06-17 NOTE — Telephone Encounter (Signed)
Patient needs this to go to [facility] on [address] [place].  I advised him he's not been reachable. I verified his number and he said it is right

## 2023-06-17 NOTE — Telephone Encounter (Signed)
Noted

## 2023-07-06 ENCOUNTER — Telehealth: Payer: Self-pay | Admitting: *Deleted

## 2023-07-06 NOTE — Telephone Encounter (Signed)
 Patient presented to the office to pick up his Ozempic from [employer].  He called back to see how much he was to take daily and was it to be refrigerated. Per Shawn Fox the patient is to  0.25 mg SQ weekly for 2 weeks then increase to 0.5 mg weekly thereafter. Patient was called and made aware.

## 2023-07-06 NOTE — Telephone Encounter (Signed)
Pt picked up pt assistance of ozempic

## 2023-07-08 ENCOUNTER — Encounter: Payer: Self-pay | Admitting: Gastroenterology

## 2023-07-19 ENCOUNTER — Encounter: Payer: Medicare HMO | Admitting: Gastroenterology

## 2023-07-19 ENCOUNTER — Ambulatory Visit: Payer: Medicare HMO | Admitting: Gastroenterology

## 2023-07-19 ENCOUNTER — Encounter: Payer: Self-pay | Admitting: Gastroenterology

## 2023-07-19 VITALS — BP 103/70 | HR 68 | Temp 98.3°F | Resp 14 | Ht 69.0 in | Wt 269.0 lb

## 2023-07-19 DIAGNOSIS — K219 Gastro-esophageal reflux disease without esophagitis: Secondary | ICD-10-CM | POA: Diagnosis not present

## 2023-07-19 DIAGNOSIS — K2950 Unspecified chronic gastritis without bleeding: Secondary | ICD-10-CM

## 2023-07-19 DIAGNOSIS — D122 Benign neoplasm of ascending colon: Secondary | ICD-10-CM

## 2023-07-19 DIAGNOSIS — Z1211 Encounter for screening for malignant neoplasm of colon: Secondary | ICD-10-CM | POA: Diagnosis present

## 2023-07-19 DIAGNOSIS — K640 First degree hemorrhoids: Secondary | ICD-10-CM

## 2023-07-19 DIAGNOSIS — Z800 Family history of malignant neoplasm of digestive organs: Secondary | ICD-10-CM

## 2023-07-19 DIAGNOSIS — K2289 Other specified disease of esophagus: Secondary | ICD-10-CM | POA: Diagnosis not present

## 2023-07-19 DIAGNOSIS — K5730 Diverticulosis of large intestine without perforation or abscess without bleeding: Secondary | ICD-10-CM

## 2023-07-19 DIAGNOSIS — K3189 Other diseases of stomach and duodenum: Secondary | ICD-10-CM | POA: Diagnosis not present

## 2023-07-19 DIAGNOSIS — D123 Benign neoplasm of transverse colon: Secondary | ICD-10-CM

## 2023-07-19 DIAGNOSIS — R109 Unspecified abdominal pain: Secondary | ICD-10-CM

## 2023-07-19 DIAGNOSIS — K317 Polyp of stomach and duodenum: Secondary | ICD-10-CM

## 2023-07-19 MED ORDER — SODIUM CHLORIDE 0.9 % IV SOLN: 500.0000 mL | Freq: Once | INTRAVENOUS | Status: DC

## 2023-07-19 NOTE — Op Note (Signed)
 [facility] Patient Name: Shawn Fox Procedure Date: 07/19/2023 1:22 PM MRN: [id number] Endoscopist: Lynann Bologna , MD, [id number] Age: 68 Referring MD:  Date of Birth: 25-May-1955 Gender: Male Account #: [id number] Procedure:                Colonoscopy Indications:              High risk colon cancer surveillance: Personal                            history of colonic polyps. FH CRC (dad age >20) Medicines:                Monitored Anesthesia Care Procedure:                Pre-Anesthesia Assessment:                           - Prior to the procedure, a History and Physical                            was performed, and patient medications and                            allergies were reviewed. The patient's tolerance of                            previous anesthesia was also reviewed. The risks                            and benefits of the procedure and the sedation                            options and risks were discussed with the patient.                            All questions were answered, and informed consent                            was obtained. Prior Anticoagulants: The patient has                            taken no anticoagulant or antiplatelet agents. ASA                            Grade Assessment: III - A patient with severe                            systemic disease. After reviewing the risks and                            benefits, the patient was deemed in satisfactory                            condition to undergo the procedure.  After obtaining informed consent, the colonoscope                            was passed under direct vision. Throughout the                            procedure, the patient's blood pressure, pulse, and                            oxygen saturations were monitored continuously. The                            CF [id number] #[id number] was introduced through the anus                            and advanced  to the the cecum, identified by                            appendiceal orifice and ileocecal valve. The                            colonoscopy was performed without difficulty. The                            patient tolerated the procedure well. The quality                            of the bowel preparation was good. The ileocecal                            valve, appendiceal orifice, and rectum were                            photographed. Scope In: 1:44:35 PM Scope Out: 2:00:15 PM Scope Withdrawal Time: 0 hours 10 minutes 32 seconds  Total Procedure Duration: 0 hours 15 minutes 40 seconds  Findings:                 Three sessile polyps were found in the proximal                            transverse colon and proximal ascending colon. The                            polyps were 4 to 6 mm in size. These polyps were                            removed with a cold snare. Resection and retrieval                            were complete.                           A few medium-mouthed diverticula were found in the  sigmoid colon,few in asc colon and desc colon.                           Non-bleeding internal hemorrhoids were found during                            retroflexion. The hemorrhoids were small and Grade                            I (internal hemorrhoids that do not prolapse).                           The exam was otherwise without abnormality on                            direct and retroflexion views. Complications:            No immediate complications. Estimated Blood Loss:     Estimated blood loss: none. Impression:               - Three 4 to 6 mm polyps in the proximal transverse                            colon and in the proximal ascending colon, removed                            with a cold snare. Resected and retrieved.                           - Mild colon diverticulosis.                           - Non-bleeding internal hemorrhoids.                            - The examination was otherwise normal on direct                            and retroflexion views. Recommendation:           - Patient has a contact number available for                            emergencies. The signs and symptoms of potential                            delayed complications were discussed with the                            patient. Return to normal activities tomorrow.                            Written discharge instructions were provided to the                            patient.                           -  Resume previous diet.                           - Continue present medications.                           - Await pathology results.                           - Repeat colonoscopy for surveillance based on                            pathology results.                           - The findings and recommendations were discussed                            with the patient's family. Lynann Bologna, MD 07/19/2023 2:07:21 PM This report has been signed electronically.

## 2023-07-19 NOTE — Patient Instructions (Signed)
 Thank you for letting us care for your healthcare needs today! Please see handouts regarding Polyps, Gastritis, Diverticulosis, and Hemorrhoids. Await pathology results.  YOU HAD AN ENDOSCOPIC PROCEDURE TODAY AT THE [facility]:   Refer to the procedure report that was given to you for any specific questions about what was found during the examination.  If the procedure report does not answer your questions, please call your gastroenterologist to clarify.  If you requested that your care partner not be given the details of your procedure findings, then the procedure report has been included in a sealed envelope for you to review at your convenience later.  YOU SHOULD EXPECT: Some feelings of bloating in the abdomen. Passage of more gas than usual.  Walking can help get rid of the air that was put into your GI tract during the procedure and reduce the bloating. If you had a lower endoscopy (such as a colonoscopy or flexible sigmoidoscopy) you may notice spotting of blood in your stool or on the toilet paper. If you underwent a bowel prep for your procedure, you may not have a normal bowel movement for a few days.  Please Note:  You might notice some irritation and congestion in your nose or some drainage.  This is from the oxygen used during your procedure.  There is no need for concern and it should clear up in a day or so.  SYMPTOMS TO REPORT IMMEDIATELY:  Following lower endoscopy (colonoscopy or flexible sigmoidoscopy):  Excessive amounts of blood in the stool  Significant tenderness or worsening of abdominal pains  Swelling of the abdomen that is new, acute  Fever of 100F or higher  Following upper endoscopy (EGD)  Vomiting of blood or coffee ground material  New chest pain or pain under the shoulder blades  Painful or persistently difficult swallowing  New shortness of breath  Fever of 100F or higher  Black, tarry-looking stools  For urgent or emergent issues, a  gastroenterologist can be reached at any hour by calling [phone]. Do not use MyChart messaging for urgent concerns.    DIET:  We do recommend a small meal at first, but then you may proceed to your regular diet.  Drink plenty of fluids but you should avoid alcoholic beverages for 24 hours.  ACTIVITY:  You should plan to take it easy for the rest of today and you should NOT DRIVE or use heavy machinery until tomorrow (because of the sedation medicines used during the test).    FOLLOW UP: Our staff will call the number listed on your records the next business day following your procedure.  We will call around 7:15- 8:00 am to check on you and address any questions or concerns that you may have regarding the information given to you following your procedure. If we do not reach you, we will leave a message.     If any biopsies were taken you will be contacted by phone or by letter within the next 1-3 weeks.  Please call us at [phone] if you have not heard about the biopsies in 3 weeks.    SIGNATURES/CONFIDENTIALITY: You and/or your care partner have signed paperwork which will be entered into your electronic medical record.  These signatures attest to the fact that that the information above on your After Visit Summary has been reviewed and is understood.  Full responsibility of the confidentiality of this discharge information lies with you and/or your care-partner.

## 2023-07-19 NOTE — Progress Notes (Signed)
 Chief Complaint: FU  Referring Provider:  Arlina Robes, *      ASSESSMENT AND PLAN;   #1. Abdo pain - likely related to CPAP. R/O other causes. #2. 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 #2. Int hoids. Nl CBC #3. FH of colon cancer (dad at age 66).  Neg colon 06/2019 except for pancolonic div, int hemorrhoids.  Next due 06/2022  Plan: - Protonix 40mg  po qAM #90, 4 refills. Can try every other day. - EGD/colon Feb/march 2025 - Encouraged him to start exercising and try to reduce weight.  Avoid fatty foods, foods with high fructose corn syrup. -If still with abdo pain, consider CT Abdo pelvis with contrast   HPI:    Shawn Fox is a 68 y.o. male  With DM2, OSA on CPAP, anxiety/dep, HLD, HTN, short seg Barretts, neg cardiac stress test @ [place] Sept 2024  For follow-up visit  Discussed the use of AI scribe software for clinical note transcription with the patient, who gave verbal consent to proceed.  History of Present Illness   The patient, with a history of diabetes, Barrett's esophagus, and sleep apnea managed with CPAP, presented with intermittent abdominal pain that has been ongoing for approximately three months. The pain is described as a level five on a scale of one to ten, located around the belly button and extending across the abdomen. The patient noted that the pain seems to be associated with the use of their CPAP machine, as they do not experience the pain on nights when the machine is not used. The patient denied experiencing bloating, nausea, or vomiting in association with the pain.  The patient also reported occasional diarrhea, which they attributed to their diet, particularly the consumption of greasy or spicy foods. They noted that they do not have a gallbladder, which may contribute to this issue. The patient also reported passing a significant amount of gas, particularly in the mornings.  The patient has a history of internal  hemorrhoids, which occasionally cause blood in their stool. They are due for a colonoscopy this year. The patient is also on pantoprazole for reflux associated with their Barrett's esophagus and has been on this medication for at least three years.  The patient has been struggling with weight loss, with their weight fluctuating between losses and gains. They have recently started trying to eat a healthier diet, including more fish, in an attempt to manage their diabetes and lose weight. They have also been prescribed a medication to assist with weight loss, but they reported that it has not been effective.  The patient has a history of depression and suicidal ideation, for which they are managed with Depakote and Effexor. They also take Flomax for prostate issues, which has reduced their frequency of urination from three to four times a night to once or twice.  The patient has been seen by a cardiologist, who conducted a stress test and other imaging studies. The results of these tests were reported to be normal. The patient's blood pressure and pulse have been well-controlled, and their blood sugar levels have been improving.       Wt Readings from Last 3 Encounters:  07/19/23 269 lb (122 kg)  04/29/23 269 lb (122 kg)  04/12/23 268 lb 3.2 oz (121.7 kg)     Past GI procedures: EGD 10/17/2021  - Short segment Barrett' s esophagus - biopsied. - Gastritis. Biopsied. - Normal examined duodenum. - Bx: Barrett's.  No dysplasia.   EGD  05/30/2018 - Z-line irregular, 40 cm from the incisors. Bx-Barrett's esophagus. Neg Bx for EoE - Mild gastritis. Biopsied. Neg HP - A single gastric polyp. Resected and retrieved. Bx-fundic gland polyps Heme pos stools, had hoids.  Colonoscopy 06/22/2019 (FH-dad>60) -Mild pancolonic diverticulosis predominantly in the sigmoid colon. -Non-bleeding internal hemorrhoids. -Otherwise normal colonoscopy.  SH: Wife - RN Unfortunately his mother passed away-had dementia  and had hypothermia when she accidentally locked herself out Past Medical History:  Diagnosis Date   Anxiety    Arthritis    Asthma    Barrett's esophagus    Bronchitis, simple, chronic (HCC)    has after flu or colds.  Had flu 1 month ago.   Carpal tunnel syndrome    Complication of anesthesia    vomiting   Cough    recent-   Depression    Diabetes mellitus without complication (HCC)    DJD (degenerative joint disease)    Fatty liver    GERD (gastroesophageal reflux disease)    Hyperlipidemia    Hypertension    IBS (irritable bowel syndrome)    Kidney stone    Pneumonia    had viral pneumonia 11/12-better   PONV (postoperative nausea and vomiting)    Sleep apnea    wears cpap    Past Surgical History:  Procedure Laterality Date   CARPAL TUNNEL RELEASE Bilateral    both   CHOLECYSTECTOMY     COLONOSCOPY  05/13/2016   Diverticulosis without bleeding pancolonic diverticulosis more prominent in sigmoid colon internal hemorrhoids second degree   CYSTOSCOPY W/ STONE MANIPULATION     ESOPHAGOGASTRODUODENOSCOPY  04/27/2005   normal esophagus moderate gastritis mainly involing the antrum in form of streaky erythemav duodenum normal   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   KNEE ARTHROSCOPY     left 3 x   KNEE ARTHROSCOPY     rt x2   MASS EXCISION  04/27/2011   Procedure: EXCISION MASS;  Surgeon: Nestor Lewandowsky;  Location: [facility];  Service: Orthopedics;  Laterality: Right;  RT 4TH FINGER GANGLION CYST EXCISION   SHOULDER SURGERY Left 2012   bone spur removed   UPPER GASTROINTESTINAL ENDOSCOPY      Family History  Problem Relation Age of Onset   Colon cancer Father    Rectal cancer Father    Colon polyps Father    Stomach cancer Father    Esophageal cancer Paternal Grandfather    Other Daughter        tumor removed from stomach    Social History   Tobacco Use   Smoking status: Never   Smokeless tobacco: Never  Vaping Use   Vaping  status: Never Used  Substance Use Topics   Alcohol use: Not Currently    Comment: last one 3 years    Drug use: No    Current Outpatient Medications  Medication Sig Dispense Refill   cyanocobalamin 100 MCG tablet Take by mouth.     divalproex (DEPAKOTE) 500 MG DR tablet Take 500 mg by mouth 2 (two) times daily.     ezetimibe (ZETIA) 10 MG tablet Take 10 mg by mouth daily.     insulin degludec (TRESIBA FLEXTOUCH) 200 UNIT/ML FlexTouch Pen Inject 80 Units into the skin at bedtime. 45 mL 3   olmesartan (BENICAR) 20 MG tablet Take 20 mg by mouth daily.     pantoprazole (PROTONIX) 40 MG tablet TAKE 1 TABLET(40 MG) BY MOUTH IN THE MORNING 90 tablet  4   rosuvastatin (CRESTOR) 10 MG tablet Take by mouth.     VITAMIN D, CHOLECALCIFEROL, PO Take 1 tablet by mouth daily.     Continuous Glucose Sensor (DEXCOM G7 SENSOR) MISC Inject 1 Application into the skin as directed. Change sensor every 10 days as directed. (Patient not taking: Reported on 07/19/2023) 9 each 3   divalproex (DEPAKOTE ER) 500 MG 24 hr tablet SMARTSIG:1 Tablet(s) By Mouth Every 12 Hours     meloxicam (MOBIC) 15 MG tablet Take 15 mg by mouth daily. As needed.     Multiple Vitamins-Minerals (CENTRUM SILVER 50+MEN PO) daily.     tamsulosin (FLOMAX) 0.4 MG CAPS capsule Take 0.4 mg by mouth daily.     venlafaxine XR (EFFEXOR-XR) 75 MG 24 hr capsule Take 1 capsule by mouth daily.     zolpidem (AMBIEN) 5 MG tablet Take 5 mg by mouth at bedtime as needed.     Current Facility-Administered Medications  Medication Dose Route Frequency Provider Last Rate Last Admin   0.9 %  sodium chloride infusion  500 mL Intravenous Once Lynann Bologna, MD        Allergies  Allergen Reactions   Dapagliflozin Other (See Comments)    Yeast infection    Demerol Nausea And Vomiting   Lisinopril Cough   Metformin Diarrhea    Review of Systems:  neg     Physical Exam:    BP 113/68   Pulse 69   Temp 98.3 F (36.8 C) (Temporal)   Resp 15   Ht 5'  9" (1.753 m)   Wt 269 lb (122 kg)   SpO2 98%   BMI 39.72 kg/m  Filed Weights   07/19/23 1254  Weight: 269 lb (122 kg)   Constitutional:  Well-developed, in no acute distress. Psychiatric: Normal mood and affect. Behavior is normal. HEENT: Pupils normal.  Conjunctivae are normal. No scleral icterus.  Normal throat. Cardiovascular: Normal rate, regular rhythm. No edema Pulmonary/chest: Effort normal and breath sounds normal. No wheezing, rales or rhonchi. Abdominal: Soft, nondistended. Nontender. Bowel sounds active throughout. There are no masses palpable. No hepatomegaly. Rectal:  defered Neurological: Alert and oriented to person place and time. Skin: Skin is warm and dry. No rashes noted.   Edman Circle, MD 07/19/2023, 1:29 PM  Cc: Arlina Robes, *

## 2023-07-19 NOTE — Op Note (Signed)
 [facility] Patient Name: Shawn Fox Procedure Date: 07/19/2023 1:23 PM MRN: [id number] Endoscopist: Lynann Bologna , MD, [id number] Age: 68 Referring MD:  Date of Birth: [birth year] Gender: Male Account #: [id number] Procedure:                Upper GI endoscopy Indications:              FU Barrett's esophagus Medicines:                Monitored Anesthesia Care Procedure:                Pre-Anesthesia Assessment:                           - Prior to the procedure, a History and Physical                            was performed, and patient medications and                            allergies were reviewed. The patient's tolerance of                            previous anesthesia was also reviewed. The risks                            and benefits of the procedure and the sedation                            options and risks were discussed with the patient.                            All questions were answered, and informed consent                            was obtained. Prior Anticoagulants: The patient has                            taken no anticoagulant or antiplatelet agents. ASA                            Grade Assessment: III - A patient with severe                            systemic disease. After reviewing the risks and                            benefits, the patient was deemed in satisfactory                            condition to undergo the procedure.                           After obtaining informed consent, the endoscope was  passed under direct vision. Throughout the                            procedure, the patient's blood pressure, pulse, and                            oxygen saturations were monitored continuously. The                            Olympus Scope SN [id number] was introduced through the                            mouth, and advanced to the second part of duodenum.                            The upper GI endoscopy was  accomplished without                            difficulty. The patient tolerated the procedure                            well. Scope In: Scope Out: Findings:                 There were esophageal mucosal changes consistent                            with short-segment Barrett's esophagus present in                            the lower third of the esophagus. The maximum                            longitudinal extent of these mucosal changes was 1                            cm in length. Mucosa was biopsied with a cold                            forceps for histology.                           Localized mild inflammation characterized by                            erythema was found in the stomach. Biopsies were                            taken with a cold forceps for histology.                           Multiple (10-12) 3 to 5 mm sessile polyps with no                            stigmata  of recent bleeding were found in the                            gastric body. Three polyps were removed with a cold                            snare. Resection and retrieval were complete.                           The examined duodenum was normal. Complications:            No immediate complications. Estimated Blood Loss:     Estimated blood loss: none. Impression:               - Esophageal mucosal changes consistent with                            short-segment Barrett's esophagus. Biopsied.                           - Gastritis. Biopsied.                           - Multiple gastric polyps. Resected and retrieved x                            3.                           - Normal examined duodenum. Recommendation:           - Patient has a contact number available for                            emergencies. The signs and symptoms of potential                            delayed complications were discussed with the                            patient. Return to normal activities tomorrow.                             Written discharge instructions were provided to the                            patient.                           - Resume previous diet.                           - Continue present medications.                           - Await pathology results.                           -  The findings and recommendations were discussed                            with the patient's family. Lynann Bologna, MD 07/19/2023 2:02:39 PM This report has been signed electronically.

## 2023-07-19 NOTE — Progress Notes (Signed)
 Pt resting comfortably. VSS. Airway intact. SBAR complete to RN. All questions answered.

## 2023-07-20 ENCOUNTER — Telehealth: Payer: Self-pay

## 2023-07-20 NOTE — Telephone Encounter (Signed)
 Attempted to reach patient for post-procedure f/u call. No answer. Left message for him to please not hesitate to call if he has any questions/concerns regarding his care.

## 2023-07-21 ENCOUNTER — Encounter: Payer: Self-pay | Admitting: Nurse Practitioner

## 2023-07-21 ENCOUNTER — Ambulatory Visit: Payer: Medicare HMO | Admitting: Nurse Practitioner

## 2023-07-21 VITALS — BP 130/68 | HR 50 | Ht 69.0 in | Wt 266.6 lb

## 2023-07-21 DIAGNOSIS — Z794 Long term (current) use of insulin: Secondary | ICD-10-CM | POA: Diagnosis not present

## 2023-07-21 DIAGNOSIS — I10 Essential (primary) hypertension: Secondary | ICD-10-CM

## 2023-07-21 DIAGNOSIS — E559 Vitamin D deficiency, unspecified: Secondary | ICD-10-CM

## 2023-07-21 DIAGNOSIS — E782 Mixed hyperlipidemia: Secondary | ICD-10-CM

## 2023-07-21 DIAGNOSIS — E119 Type 2 diabetes mellitus without complications: Secondary | ICD-10-CM | POA: Diagnosis not present

## 2023-07-21 LAB — POCT GLYCOSYLATED HEMOGLOBIN (HGB A1C): Hemoglobin A1C: 8.4 % — AB (ref 4.0–5.6)

## 2023-07-21 MED ORDER — DEXCOM G7 SENSOR MISC: 1.0000 | 3 refills | Status: AC

## 2023-07-21 NOTE — Progress Notes (Signed)
 Endocrinology Follow Up Note       07/21/2023, 11:00 AM   Subjective:    Patient ID: Shawn Fox, male    DOB: 1955-11-07.  Marsean Elkhatib is being seen in follow up after being seen in consultation for management of currently uncontrolled symptomatic diabetes requested by  Arlina Robes, MD.   Past Medical History:  Diagnosis Date   Anxiety    Arthritis    Asthma    Barrett's esophagus    Bronchitis, simple, chronic (HCC)    has after flu or colds.  Had flu 1 month ago.   Carpal tunnel syndrome    Complication of anesthesia    vomiting   Cough    recent-   Depression    Diabetes mellitus without complication (HCC)    DJD (degenerative joint disease)    Fatty liver    GERD (gastroesophageal reflux disease)    Hyperlipidemia    Hypertension    IBS (irritable bowel syndrome)    Kidney stone    Pneumonia    had viral pneumonia 11/12-better   PONV (postoperative nausea and vomiting)    Sleep apnea    wears cpap    Past Surgical History:  Procedure Laterality Date   CARPAL TUNNEL RELEASE Bilateral    both   CHOLECYSTECTOMY     COLONOSCOPY  05/13/2016   Diverticulosis without bleeding pancolonic diverticulosis more prominent in sigmoid colon internal hemorrhoids second degree   CYSTOSCOPY W/ STONE MANIPULATION     ESOPHAGOGASTRODUODENOSCOPY  04/27/2005   normal esophagus moderate gastritis mainly involing the antrum in form of streaky erythemav duodenum normal   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   KNEE ARTHROSCOPY     left 3 x   KNEE ARTHROSCOPY     rt x2   MASS EXCISION  04/27/2011   Procedure: EXCISION MASS;  Surgeon: Nestor Lewandowsky;  Location: [facility];  Service: Orthopedics;  Laterality: Right;  RT 4TH FINGER GANGLION CYST EXCISION   SHOULDER SURGERY Left 2012   bone spur removed   UPPER GASTROINTESTINAL ENDOSCOPY      Social History    Socioeconomic History   Marital status: Married    Spouse name: Not on file   Number of children: 2   Years of education: Not on file   Highest education level: Not on file  Occupational History   Not on file  Tobacco Use   Smoking status: Never   Smokeless tobacco: Never  Vaping Use   Vaping status: Never Used  Substance and Sexual Activity   Alcohol use: Not Currently    Comment: last one 3 years    Drug use: No   Sexual activity: Not on file  Other Topics Concern   Not on file  Social History Narrative   Not on file   Social Drivers of Health   Financial Resource Strain: Not on file  Food Insecurity: Not on file  Transportation Needs: Not on file  Physical Activity: Not on file  Stress: Not on file  Social Connections: Not on file    Family History  Problem Relation Age of Onset   Colon cancer Father  Rectal cancer Father    Colon polyps Father    Stomach cancer Father    Esophageal cancer Paternal Grandfather    Other Daughter        tumor removed from stomach    Outpatient Encounter Medications as of 07/21/2023  Medication Sig   cyanocobalamin 100 MCG tablet Take by mouth.   divalproex (DEPAKOTE) 500 MG DR tablet Take 500 mg by mouth 2 (two) times daily.   ezetimibe (ZETIA) 10 MG tablet Take 10 mg by mouth daily.   insulin degludec (TRESIBA FLEXTOUCH) 200 UNIT/ML FlexTouch Pen Inject 80 Units into the skin at bedtime.   meloxicam (MOBIC) 15 MG tablet Take 15 mg by mouth daily. As needed.   olmesartan (BENICAR) 20 MG tablet Take 20 mg by mouth daily.   pantoprazole (PROTONIX) 40 MG tablet TAKE 1 TABLET(40 MG) BY MOUTH IN THE MORNING   rosuvastatin (CRESTOR) 10 MG tablet Take by mouth.   Semaglutide (OZEMPIC, 0.25 OR 0.5 MG/DOSE, [place]) Inject 0.5 mg into the skin once a week.   tamsulosin (FLOMAX) 0.4 MG CAPS capsule Take 0.4 mg by mouth daily.   VITAMIN D, CHOLECALCIFEROL, PO Take 1 tablet by mouth daily.   Continuous Glucose Sensor (DEXCOM G7 SENSOR)  MISC Inject 1 Application into the skin as directed. Change sensor every 10 days as directed.   venlafaxine XR (EFFEXOR-XR) 75 MG 24 hr capsule Take 1 capsule by mouth daily.   zolpidem (AMBIEN) 5 MG tablet Take 5 mg by mouth at bedtime as needed.   [DISCONTINUED] Continuous Glucose Sensor (DEXCOM G7 SENSOR) MISC Inject 1 Application into the skin as directed. Change sensor every 10 days as directed. (Patient not taking: Reported on 04/29/2023)   [DISCONTINUED] divalproex (DEPAKOTE ER) 500 MG 24 hr tablet SMARTSIG:1 Tablet(s) By Mouth Every 12 Hours   [DISCONTINUED] Multiple Vitamins-Minerals (CENTRUM SILVER 50+MEN PO) daily.   No facility-administered encounter medications on file as of 07/21/2023.    ALLERGIES: Allergies  Allergen Reactions   Dapagliflozin Other (See Comments)    Yeast infection    Demerol Nausea And Vomiting   Lisinopril Cough   Metformin Diarrhea    VACCINATION STATUS: Immunization History  Administered Date(s) Administered   Moderna Sars-Covid-2 Vaccination 08/05/2019, 08/07/2019, 09/05/2019, 09/08/2019, 03/27/2020    Diabetes He presents for his follow-up diabetic visit. He has type 2 diabetes mellitus. Onset time: Diagnosed at approx age of 62. His disease course has been improving. There are no hypoglycemic associated symptoms. Associated symptoms include fatigue and polydipsia. There are no hypoglycemic complications. Symptoms are stable. There are no diabetic complications. Risk factors for coronary artery disease include diabetes mellitus, male sex, obesity and hypertension. Current diabetic treatment includes insulin injections. He is compliant with treatment most of the time. His weight is increasing steadily. He is following a generally unhealthy diet. When asked about meal planning, he reported none. He has not had a previous visit with a dietitian. He participates in exercise daily. His breakfast blood glucose range is generally 110-130 mg/dl. His bedtime  blood glucose range is generally 140-180 mg/dl. (He presents today with his logs showing mostly at goal glycemic profile.  His POCT A1c today is 8.4%, improving from last visit of 9%.  He said he could not afford the CGM we sent in through [facility].  He was approved from [employer] for [place] but he did not receive the [place] with his last shipment and thus has been paying for it out of pocket.  He  just started the 0.5 mg dose of Ozempic.  He has cut down drastically on his Diet Coke consumption and is working out.) An ACE inhibitor/angiotensin II receptor blocker is being taken. He sees a podiatrist.Eye exam is current.    Review of systems  Constitutional: + Minimally fluctuating body weight,  current Body mass index is 39.37 kg/m. , no fatigue, no subjective hyperthermia, no subjective hypothermia Eyes: no blurry vision, no xerophthalmia ENT: no sore throat, no nodules palpated in throat, no dysphagia/odynophagia, no hoarseness Cardiovascular: no chest pain, no shortness of breath, no palpitations, no leg swelling Respiratory: no cough, no shortness of breath Gastrointestinal: no nausea/vomiting/diarrhea Musculoskeletal: no muscle/joint aches Skin: no rashes, no hyperemia Neurological: no tremors, no numbness, no tingling, no dizziness Psychiatric: no depression, no anxiety  Objective:     BP 130/68 (BP Location: Left Arm, Patient Position: Sitting, Cuff Size: Large)   Pulse (!) 50   Ht 5\' 9"  (1.753 m)   Wt 266 lb 9.6 oz (120.9 kg)   BMI 39.37 kg/m   Wt Readings from Last 3 Encounters:  07/21/23 266 lb 9.6 oz (120.9 kg)  07/19/23 269 lb (122 kg)  04/29/23 269 lb (122 kg)     BP Readings from Last 3 Encounters:  07/21/23 130/68  07/19/23 103/70  04/29/23 118/70      Physical Exam- Limited  Constitutional:  Body mass index is 39.37 kg/m. , not in acute distress, normal state of mind Eyes:  EOMI, no exophthalmos Neck: Supple Musculoskeletal: no gross  deformities, strength intact in all four extremities, no gross restriction of joint movements Skin:  no rashes, no hyperemia Neurological: no tremor with outstretched hands   Diabetic Foot Exam - Simple   No data filed      CMP ( most recent) CMP     Component Value Date/Time   NA 136 08/26/2010 0410   K 4.6 08/26/2010 0410   CL 101 08/26/2010 0410   CO2 30 08/26/2010 0410   GLUCOSE 175 (H) 08/26/2010 0410   BUN 16 12/29/2022 0000   CREATININE 0.9 12/29/2022 0000   CREATININE 1.23 08/26/2010 0410   CALCIUM 8.2 (L) 08/26/2010 0410   EGFR 95 12/29/2022 0000   GFRNONAA >60 08/26/2010 0410     Diabetic Labs (most recent): Lab Results  Component Value Date   HGBA1C 8.4 (A) 07/21/2023   HGBA1C 9.0 (A) 04/12/2023   HGBA1C 8 12/29/2022     Lipid Panel ( most recent) Lipid Panel  No results found for: "CHOL", "TRIG", "HDL", "CHOLHDL", "VLDL", "LDLCALC", "LDLDIRECT", "LABVLDL"    No results found for: "TSH", "FREET4"         Assessment & Plan:   1) Type 2 diabetes mellitus without complication, with long-term current use of insulin (HCC)  He presents today with his logs showing mostly at goal glycemic profile.  His POCT A1c today is 8.4%, improving from last visit of 9%.  He said he could not afford the CGM we sent in through [facility].  He was approved from [employer] for [place] but he did not receive the [place] with his last shipment and thus has been paying for it out of pocket.  He just started the 0.5 mg dose of Ozempic.  He has cut down drastically on his Diet Coke consumption and is working out.  - Jontae Adebayo has currently uncontrolled symptomatic type 2 DM since 68 years of age.   -Recent labs reviewed.  - I had a long  discussion with him about the progressive nature of diabetes and the pathology behind its complications. -his diabetes is not currently complicated but he remains at a high risk for more acute and chronic complications  which include CAD, CVA, CKD, retinopathy, and neuropathy. These are all discussed in detail with him.  The following Lifestyle Medicine recommendations according to American College of Lifestyle Medicine [facility]) were discussed and offered to patient and he agrees to start the journey:  A. Whole Foods, Plant-based plate comprising of fruits and vegetables, plant-based proteins, whole-grain carbohydrates was discussed in detail with the patient.   A list for source of those nutrients were also provided to the patient.  Patient will use only water or unsweetened tea for hydration. B.  The need to stay away from risky substances including alcohol, smoking; obtaining 7 to 9 hours of restorative sleep, at least 150 minutes of moderate intensity exercise weekly, the importance of healthy social connections,  and stress reduction techniques were discussed. C.  A full color page of  Calorie density of various food groups per pound showing examples of each food groups was provided to the patient.  - Nutritional counseling repeated at each appointment due to patients tendency to fall back in to old habits.  - The patient admits there is a room for improvement in their diet and drink choices. -  Suggestion is made for the patient to avoid simple carbohydrates from their diet including Cakes, Sweet Desserts / Pastries, Ice Cream, Soda (diet and regular), Sweet Tea, Candies, Chips, Cookies, Sweet Pastries, Store Bought Juices, Alcohol in Excess of 1-2 drinks a day, Artificial Sweeteners, Coffee Creamer, and "Sugar-free" Products. This will help patient to have stable blood glucose profile and potentially avoid unintended weight gain.   - I encouraged the patient to switch to unprocessed or minimally processed complex starch and increased protein intake (animal or plant source), fruits, and vegetables.   - Patient is advised to stick to a routine mealtimes to eat 3 meals a day and avoid unnecessary snacks (to snack  only to correct hypoglycemia).  - I have approached him with the following individualized plan to manage his diabetes and patient agrees:   -He is advised to continue Tresiba 80 units SQ nightly and Ozempic 0.5 mg SQ weekly.  I encouraged him to reach out to patient assistance to see about when his shipment of medications is due to come in or ask for voucher if not going to be soon.  -he is encouraged to start/continue monitoring glucose 2 times daily, before breakfast and before bed, and to call the clinic if he had readings less than 70 or above 300 for 3 tests in a row.  I did send in script for Dexcom G7 as I think it will help him choose healthier foods if he can see what happens after eating but it was not optimally covered by [facility].  Will try sending to [facility] to see if it is covered better under pharmacy benefit.    - he is warned not to take insulin without proper monitoring per orders. - Adjustment parameters are given to him for hypo and hyperglycemia in writing.  - Specific targets for  A1c; LDL, HDL, and Triglycerides were discussed with the patient.  2) Blood Pressure /Hypertension:  his blood pressure is controlled to target.   he is advised to continue his current medications including Olmesartan 20 mg p.o. daily with breakfast.  3) Lipids/Hyperlipidemia:    There is no recent  lipid panel available to review.  he is advised to continue Crestor 20 mg daily at bedtime.  Side effects and precautions discussed with him.  4)  Weight/Diet:  his Body mass index is 39.37 kg/m.  -  clearly complicating his diabetes care.   he is a candidate for weight loss. I discussed with him the fact that loss of 5 - 10% of his  current body weight will have the most impact on his diabetes management.  Exercise, and detailed carbohydrates information provided  -  detailed on discharge instructions.  5) Chronic Care/Health Maintenance: -he is on ACEI/ARB and Statin medications and is encouraged  to initiate and continue to follow up with Ophthalmology, Dentist, Podiatrist at least yearly or according to recommendations, and advised to stay away from smoking. I have recommended yearly flu vaccine and pneumonia vaccine at least every 5 years; moderate intensity exercise for up to 150 minutes weekly; and sleep for at least 7 hours a day.  - he is advised to maintain close follow up with Arlina Robes, MD for primary care needs, as well as his other providers for optimal and coordinated care.     I spent  35  minutes in the care of the patient today including review of labs from CMP, Lipids, Thyroid Function, Hematology (current and previous including abstractions from other facilities); face-to-face time discussing  his blood glucose readings/logs, discussing hypoglycemia and hyperglycemia episodes and symptoms, medications doses, his options of short and long term treatment based on the latest standards of care / guidelines;  discussion about incorporating lifestyle medicine;  and documenting the encounter. Risk reduction counseling performed per USPSTF guidelines to reduce obesity and cardiovascular risk factors.     Please refer to Patient Instructions for Blood Glucose Monitoring and Insulin/Medications Dosing Guide"  in media tab for additional information. Please  also refer to " Patient Self Inventory" in the Media  tab for reviewed elements of pertinent patient history.  Elpidio Galea participated in the discussions, expressed understanding, and voiced agreement with the above plans.  All questions were answered to his satisfaction. he is encouraged to contact clinic should he have any questions or concerns prior to his return visit.     Follow up plan: - Return in about 3 months (around 10/21/2023) for Diabetes F/U with A1c in office, No previsit labs, Bring meter and logs.   Ronny Bacon, [facility] [address] [place], [place]  [address] Phone: [phone] Fax: [phone]  07/21/2023, 11:00 AM

## 2023-07-22 LAB — SURGICAL PATHOLOGY

## 2023-07-26 ENCOUNTER — Encounter: Payer: Self-pay | Admitting: Gastroenterology

## 2023-08-02 ENCOUNTER — Other Ambulatory Visit: Payer: Self-pay | Admitting: *Deleted

## 2023-08-02 DIAGNOSIS — Z794 Long term (current) use of insulin: Secondary | ICD-10-CM

## 2023-08-02 DIAGNOSIS — I10 Essential (primary) hypertension: Secondary | ICD-10-CM

## 2023-08-02 DIAGNOSIS — E782 Mixed hyperlipidemia: Secondary | ICD-10-CM

## 2023-08-02 DIAGNOSIS — E559 Vitamin D deficiency, unspecified: Secondary | ICD-10-CM

## 2023-08-02 MED ORDER — TRESIBA FLEXTOUCH 200 UNIT/ML ~~LOC~~ SOPN: 80.0000 [IU] | PEN_INJECTOR | Freq: Every day | SUBCUTANEOUS | 0 refills | Status: DC

## 2023-08-03 ENCOUNTER — Other Ambulatory Visit: Payer: Self-pay | Admitting: *Deleted

## 2023-08-03 DIAGNOSIS — Z794 Long term (current) use of insulin: Secondary | ICD-10-CM

## 2023-08-03 DIAGNOSIS — E119 Type 2 diabetes mellitus without complications: Secondary | ICD-10-CM

## 2023-08-03 MED ORDER — TRESIBA FLEXTOUCH 200 UNIT/ML ~~LOC~~ SOPN: 80.0000 [IU] | PEN_INJECTOR | Freq: Every day | SUBCUTANEOUS | 0 refills | Status: DC

## 2023-08-05 ENCOUNTER — Telehealth: Payer: Self-pay | Admitting: Nurse Practitioner

## 2023-08-05 NOTE — Telephone Encounter (Signed)
 Pt picked up pt assistance.

## 2023-09-29 ENCOUNTER — Telehealth: Payer: Self-pay | Admitting: Nurse Practitioner

## 2023-09-29 NOTE — Telephone Encounter (Signed)
 Left VM that his Ozempic is here ready for pick up

## 2023-10-04 ENCOUNTER — Telehealth: Payer: Self-pay | Admitting: *Deleted

## 2023-10-04 NOTE — Telephone Encounter (Signed)
 Patient left a voicemail.He said that his Dexcom G 7  sensors had given him a problem. He shared that 2 of the sensors stop working immediately , the others, his elbow got hit knocking it out of his arm and bending the needle. He shares that he had been 40 days with this. He call [facility] and they told him that they did not handle the CGM. He is asking for samples as his next refill is not until the middle of June, or if his prescription can be sent out earlier?  Patient called and advised that he needs to call Dexom and share with them that the 4 sensors failed. Telephone number for Dexcom provided. The patient is using a regular meter checking his blood sugars Talked with Whitney , we may give one sample. Patient was called and made aware. He plan to pick up the sensor along with his Ozempic on Wednesday.

## 2023-10-06 NOTE — Telephone Encounter (Signed)
 Pt picked up pt assitance ozempic and dexcom sensor sample

## 2023-10-21 ENCOUNTER — Ambulatory Visit: Admitting: Nurse Practitioner

## 2023-10-21 ENCOUNTER — Encounter: Payer: Self-pay | Admitting: Nurse Practitioner

## 2023-10-21 VITALS — BP 128/74 | HR 62 | Ht 69.0 in | Wt 257.4 lb

## 2023-10-21 DIAGNOSIS — I10 Essential (primary) hypertension: Secondary | ICD-10-CM

## 2023-10-21 DIAGNOSIS — E119 Type 2 diabetes mellitus without complications: Secondary | ICD-10-CM

## 2023-10-21 DIAGNOSIS — Z794 Long term (current) use of insulin: Secondary | ICD-10-CM | POA: Diagnosis not present

## 2023-10-21 DIAGNOSIS — E782 Mixed hyperlipidemia: Secondary | ICD-10-CM

## 2023-10-21 DIAGNOSIS — E559 Vitamin D deficiency, unspecified: Secondary | ICD-10-CM

## 2023-10-21 LAB — POCT GLYCOSYLATED HEMOGLOBIN (HGB A1C): Hemoglobin A1C: 6 % — AB (ref 4.0–5.6)

## 2023-10-21 NOTE — Progress Notes (Signed)
 Endocrinology Follow Up Note       10/21/2023, 12:00 PM   Subjective:    Patient ID: Shawn Fox, male    DOB: 12/15/1955.  Shawn Fox is being seen in follow up after being seen in consultation for management of currently uncontrolled symptomatic diabetes requested by  Shawn Danes, MD.   Past Medical History:  Diagnosis Date   Anxiety    Arthritis    Asthma    Barrett's esophagus    Bronchitis, simple, chronic (HCC)    has after flu or colds.  Had flu 1 month ago.   Carpal tunnel syndrome    Complication of anesthesia    vomiting   Cough    recent-   Depression    Diabetes mellitus without complication (HCC)    DJD (degenerative joint disease)    Fatty liver    GERD (gastroesophageal reflux disease)    Hyperlipidemia    Hypertension    IBS (irritable bowel syndrome)    Kidney stone    Pneumonia    had viral pneumonia 11/12-better   PONV (postoperative nausea and vomiting)    Sleep apnea    wears cpap    Past Surgical History:  Procedure Laterality Date   CARPAL TUNNEL RELEASE Bilateral    both   CHOLECYSTECTOMY     COLONOSCOPY  05/13/2016   Diverticulosis without bleeding pancolonic diverticulosis more prominent in sigmoid colon internal hemorrhoids second degree   CYSTOSCOPY W/ STONE MANIPULATION     ESOPHAGOGASTRODUODENOSCOPY  04/27/2005   normal esophagus moderate gastritis mainly involing the antrum in form of streaky erythemav duodenum normal   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   KNEE ARTHROSCOPY     left 3 x   KNEE ARTHROSCOPY     rt x2   MASS EXCISION  04/27/2011   Procedure: EXCISION MASS;  Surgeon: Ilean Mall;  Location: [facility];  Service: Orthopedics;  Laterality: Right;  RT 4TH FINGER GANGLION CYST EXCISION   SHOULDER SURGERY Left 2012   bone spur removed   UPPER GASTROINTESTINAL ENDOSCOPY      Social History    Socioeconomic History   Marital status: Married    Spouse name: Not on file   Number of children: 2   Years of education: Not on file   Highest education level: Not on file  Occupational History   Not on file  Tobacco Use   Smoking status: Never   Smokeless tobacco: Never  Vaping Use   Vaping status: Never Used  Substance and Sexual Activity   Alcohol use: Not Currently    Comment: last one 3 years    Drug use: No   Sexual activity: Not on file  Other Topics Concern   Not on file  Social History Narrative   Not on file   Social Drivers of Health   Financial Resource Strain: Not on file  Food Insecurity: Not on file  Transportation Needs: Not on file  Physical Activity: Not on file  Stress: Not on file  Social Connections: Not on file    Family History  Problem Relation Age of Onset   Colon cancer Father  Rectal cancer Father    Colon polyps Father    Stomach cancer Father    Esophageal cancer Paternal Grandfather    Other Daughter        tumor removed from stomach    Outpatient Encounter Medications as of 10/21/2023  Medication Sig   Continuous Glucose Sensor (DEXCOM G7 SENSOR) MISC Inject 1 Application into the skin as directed. Change sensor every 10 days as directed.   cyanocobalamin 100 MCG tablet Take by mouth.   divalproex (DEPAKOTE) 500 MG DR tablet Take 500 mg by mouth 2 (two) times daily.   ezetimibe (ZETIA) 10 MG tablet Take 10 mg by mouth daily.   insulin degludec  (TRESIBA  FLEXTOUCH) 200 UNIT/ML FlexTouch Pen Inject 80 Units into the skin at bedtime.   meloxicam (MOBIC) 15 MG tablet Take 15 mg by mouth daily. As needed.   olmesartan (BENICAR) 20 MG tablet Take 20 mg by mouth daily.   pantoprazole  (PROTONIX ) 40 MG tablet TAKE 1 TABLET(40 MG) BY MOUTH IN THE MORNING   rosuvastatin (CRESTOR) 10 MG tablet Take by mouth.   Semaglutide (OZEMPIC, 0.25 OR 0.5 MG/DOSE, [place]) Inject 0.5 mg into the skin once a week.   Venlafaxine HCl 150 MG TB24 Take 150 mg  by mouth daily.   VITAMIN D, CHOLECALCIFEROL, PO Take 1 tablet by mouth daily.   tamsulosin (FLOMAX) 0.4 MG CAPS capsule Take 0.4 mg by mouth daily. (Patient not taking: Reported on 10/21/2023)   venlafaxine XR (EFFEXOR-XR) 75 MG 24 hr capsule Take 1 capsule by mouth daily.   zolpidem (AMBIEN) 5 MG tablet Take 5 mg by mouth at bedtime as needed.   No facility-administered encounter medications on file as of 10/21/2023.    ALLERGIES: Allergies  Allergen Reactions   Dapagliflozin Other (See Comments)    Yeast infection    Demerol Nausea And Vomiting   Lisinopril Cough   Metformin Diarrhea    VACCINATION STATUS: Immunization History  Administered Date(s) Administered   Moderna Sars-Covid-2 Vaccination 08/05/2019, 08/07/2019, 09/05/2019, 09/08/2019, 03/27/2020    Diabetes He presents for his follow-up diabetic visit. He has type 2 diabetes mellitus. Onset time: Diagnosed at approx age of 61. His disease course has been improving. There are no hypoglycemic associated symptoms. There are no diabetic associated symptoms. There are no hypoglycemic complications. Symptoms are stable. There are no diabetic complications. Risk factors for coronary artery disease include diabetes mellitus, male sex, obesity and hypertension. Current diabetic treatment includes insulin injections (and Ozempic). He is compliant with treatment most of the time. His weight is decreasing steadily. He is following a generally unhealthy diet. When asked about meal planning, he reported none. He has not had a previous visit with a dietitian. He participates in exercise daily. His home blood glucose trend is decreasing steadily. His overall blood glucose range is 110-130 mg/dl. (He presents today with his CGM showing at target glycemic profile overall.  His POCT A1c today is 6%, improving from last visit of 8.4%.  He has lost 10 lbs since last visit, is feeling well.  He notes the Ozempic has helped him with portion control.   Analysis of his CGM shows TIR 85%, TAR 15%, TBR 0% with a GMI of 6.5%.) An ACE inhibitor/angiotensin II receptor blocker is being taken. He sees a podiatrist.Eye exam is current.    Review of systems  Constitutional: + decreasing body weight,  current Body mass index is 38.01 kg/m. , no fatigue, no subjective hyperthermia, no subjective hypothermia Eyes: no blurry vision,  no xerophthalmia ENT: no sore throat, no nodules palpated in throat, no dysphagia/odynophagia, no hoarseness Cardiovascular: no chest pain, no shortness of breath, no palpitations, no leg swelling Respiratory: no cough, no shortness of breath Gastrointestinal: no nausea/vomiting/diarrhea Musculoskeletal: no muscle/joint aches Skin: no rashes, no hyperemia Neurological: no tremors, no numbness, no tingling, no dizziness Psychiatric: no depression, no anxiety  Objective:     BP 128/74 (BP Location: Left Arm, Patient Position: Sitting, Cuff Size: Large)   Pulse 62   Ht 5\' 9"  (1.753 m)   Wt 257 lb 6.4 oz (116.8 kg)   BMI 38.01 kg/m   Wt Readings from Last 3 Encounters:  10/21/23 257 lb 6.4 oz (116.8 kg)  07/21/23 266 lb 9.6 oz (120.9 kg)  07/19/23 269 lb (122 kg)     BP Readings from Last 3 Encounters:  10/21/23 128/74  07/21/23 130/68  07/19/23 103/70       Physical Exam- Limited  Constitutional:  Body mass index is 38.01 kg/m. , not in acute distress, normal state of mind Eyes:  EOMI, no exophthalmos Musculoskeletal: no gross deformities, strength intact in all four extremities, no gross restriction of joint movements Skin:  no rashes, no hyperemia Neurological: no tremor with outstretched hands   Diabetic Foot Exam - Simple   No data filed      CMP ( most recent) CMP     Component Value Date/Time   NA 136 08/26/2010 0410   K 4.6 08/26/2010 0410   CL 101 08/26/2010 0410   CO2 30 08/26/2010 0410   GLUCOSE 175 (H) 08/26/2010 0410   BUN 16 12/29/2022 0000   CREATININE 0.9 12/29/2022 0000    CREATININE 1.23 08/26/2010 0410   CALCIUM 8.2 (L) 08/26/2010 0410   EGFR 95 12/29/2022 0000   GFRNONAA >60 08/26/2010 0410     Diabetic Labs (most recent): Lab Results  Component Value Date   HGBA1C 6.0 (A) 10/21/2023   HGBA1C 8.4 (A) 07/21/2023   HGBA1C 9.0 (A) 04/12/2023     Lipid Panel ( most recent) Lipid Panel  No results found for: "CHOL", "TRIG", "HDL", "CHOLHDL", "VLDL", "LDLCALC", "LDLDIRECT", "LABVLDL"    No results found for: "TSH", "FREET4"         Assessment & Plan:   1) Type 2 diabetes mellitus without complication, with long-term current use of insulin (HCC)  He presents today with his CGM showing at target glycemic profile overall.  His POCT A1c today is 6%, improving from last visit of 8.4%.  He has lost 10 lbs since last visit, is feeling well.  He notes the Ozempic has helped him with portion control.  Analysis of his CGM shows TIR 85%, TAR 15%, TBR 0% with a GMI of 6.5%.  - Shawn Fox has currently uncontrolled symptomatic type 2 DM since 68 years of age.   -Recent labs reviewed.  - I had a long discussion with him about the progressive nature of diabetes and the pathology behind its complications. -his diabetes is not currently complicated but he remains at a high risk for more acute and chronic complications which include CAD, CVA, CKD, retinopathy, and neuropathy. These are all discussed in detail with him.  The following Lifestyle Medicine recommendations according to American College of Lifestyle Medicine [facility]) were discussed and offered to patient and he agrees to start the journey:  A. Whole Foods, Plant-based plate comprising of fruits and vegetables, plant-based proteins, whole-grain carbohydrates was discussed in detail with the patient.   A list for  source of those nutrients were also provided to the patient.  Patient will use only water or unsweetened tea for hydration. B.  The need to stay away from risky substances including alcohol,  smoking; obtaining 7 to 9 hours of restorative sleep, at least 150 minutes of moderate intensity exercise weekly, the importance of healthy social connections,  and stress reduction techniques were discussed. C.  A full color page of  Calorie density of various food groups per pound showing examples of each food groups was provided to the patient.  - Nutritional counseling repeated at each appointment due to patients tendency to fall back in to old habits.  - The patient admits there is a room for improvement in their diet and drink choices. -  Suggestion is made for the patient to avoid simple carbohydrates from their diet including Cakes, Sweet Desserts / Pastries, Ice Cream, Soda (diet and regular), Sweet Tea, Candies, Chips, Cookies, Sweet Pastries, Store Bought Juices, Alcohol in Excess of 1-2 drinks a day, Artificial Sweeteners, Coffee Creamer, and "Sugar-free" Products. This will help patient to have stable blood glucose profile and potentially avoid unintended weight gain.   - I encouraged the patient to switch to unprocessed or minimally processed complex starch and increased protein intake (animal or plant source), fruits, and vegetables.   - Patient is advised to stick to a routine mealtimes to eat 3 meals a day and avoid unnecessary snacks (to snack only to correct hypoglycemia).  - I have approached him with the following individualized plan to manage his diabetes and patient agrees:   -He is advised to lower his Tresiba  to 60 units SQ nightly and continue Ozempic 0.5 mg SQ weekly.  May increase Ozempic at next visit.  -he is encouraged to start/continue monitoring glucose 2 times daily, before breakfast and before bed, and to call the clinic if he had readings less than 70 or above 300 for 3 tests in a row.  I did send in script for Dexcom G7 as I think it will help him choose healthier foods if he can see what happens after eating but it was not optimally covered by [facility].  Will try  sending to [facility] to see if it is covered better under pharmacy benefit.    - he is warned not to take insulin without proper monitoring per orders. - Adjustment parameters are given to him for hypo and hyperglycemia in writing.  - Specific targets for  A1c; LDL, HDL, and Triglycerides were discussed with the patient.  2) Blood Pressure /Hypertension:  his blood pressure is controlled to target.   he is advised to continue his current medications including Olmesartan 20 mg p.o. daily with breakfast.  3) Lipids/Hyperlipidemia:    There is no recent lipid panel available to review.  he is advised to continue Crestor 20 mg daily at bedtime.  Side effects and precautions discussed with him.  4)  Weight/Diet:  his Body mass index is 38.01 kg/m.  -  clearly complicating his diabetes care.   he is a candidate for weight loss. I discussed with him the fact that loss of 5 - 10% of his  current body weight will have the most impact on his diabetes management.  Exercise, and detailed carbohydrates information provided  -  detailed on discharge instructions.  5) Chronic Care/Health Maintenance: -he is on ACEI/ARB and Statin medications and is encouraged to initiate and continue to follow up with Ophthalmology, Dentist, Podiatrist at least yearly or according to  recommendations, and advised to stay away from smoking. I have recommended yearly flu vaccine and pneumonia vaccine at least every 5 years; moderate intensity exercise for up to 150 minutes weekly; and sleep for at least 7 hours a day.  - he is advised to maintain close follow up with Shawn Danes, MD for primary care needs, as well as his other providers for optimal and coordinated care.     I spent  50  minutes in the care of the patient today including review of labs from CMP, Lipids, Thyroid Function, Hematology (current and previous including abstractions from other facilities); face-to-face time discussing  his blood glucose  readings/logs, discussing hypoglycemia and hyperglycemia episodes and symptoms, medications doses, his options of short and long term treatment based on the latest standards of care / guidelines;  discussion about incorporating lifestyle medicine;  and documenting the encounter. Risk reduction counseling performed per USPSTF guidelines to reduce obesity and cardiovascular risk factors.     Please refer to Patient Instructions for Blood Glucose Monitoring and Insulin/Medications Dosing Guide"  in media tab for additional information. Please  also refer to " Patient Self Inventory" in the Media  tab for reviewed elements of pertinent patient history.  Shawn Fox participated in the discussions, expressed understanding, and voiced agreement with the above plans.  All questions were answered to his satisfaction. he is encouraged to contact clinic should he have any questions or concerns prior to his return visit.     Follow up plan: - Return in about 4 months (around 02/20/2024) for Diabetes F/U with A1c in office.   Hulon Magic, [facility] [address] [place], [place] [address] Phone: [phone] Fax: [phone]  10/21/2023, 12:00 PM

## 2023-10-28 ENCOUNTER — Other Ambulatory Visit: Payer: Self-pay

## 2023-10-28 DIAGNOSIS — R059 Cough, unspecified: Secondary | ICD-10-CM

## 2023-10-28 MED ORDER — PANTOPRAZOLE SODIUM 40 MG PO TBEC
DELAYED_RELEASE_TABLET | ORAL | 4 refills | Status: AC
Start: 2023-10-28 — End: ?

## 2023-10-28 NOTE — Progress Notes (Signed)
Refill request from [facility].

## 2023-11-17 ENCOUNTER — Telehealth: Payer: Self-pay | Admitting: Nurse Practitioner

## 2023-11-17 NOTE — Telephone Encounter (Signed)
 Called pt to let him know that his PAP is here ready for pick up. I had to leave a VM

## 2023-11-17 NOTE — Telephone Encounter (Signed)
 Pt picked up meds.

## 2023-12-14 ENCOUNTER — Ambulatory Visit: Admitting: Urology

## 2024-01-05 ENCOUNTER — Telehealth: Payer: Self-pay | Admitting: Nurse Practitioner

## 2024-01-05 NOTE — Telephone Encounter (Signed)
 Called pt to let him know that his ozempic is here for pick up. He will pick up next week

## 2024-01-10 NOTE — Telephone Encounter (Signed)
Pt picked up pt assistance of ozempic

## 2024-02-21 ENCOUNTER — Telehealth: Payer: Self-pay | Admitting: Nurse Practitioner

## 2024-02-21 NOTE — Telephone Encounter (Signed)
 LVM to let pt know PAP of Tresiba  is ready for pick up

## 2024-02-23 ENCOUNTER — Encounter: Payer: Self-pay | Admitting: Nurse Practitioner

## 2024-02-23 ENCOUNTER — Ambulatory Visit: Admitting: Nurse Practitioner

## 2024-02-23 VITALS — BP 112/80 | HR 72 | Ht 69.0 in | Wt 253.0 lb

## 2024-02-23 DIAGNOSIS — E119 Type 2 diabetes mellitus without complications: Secondary | ICD-10-CM

## 2024-02-23 DIAGNOSIS — E782 Mixed hyperlipidemia: Secondary | ICD-10-CM | POA: Diagnosis not present

## 2024-02-23 DIAGNOSIS — Z794 Long term (current) use of insulin: Secondary | ICD-10-CM | POA: Diagnosis not present

## 2024-02-23 DIAGNOSIS — I10 Essential (primary) hypertension: Secondary | ICD-10-CM

## 2024-02-23 DIAGNOSIS — E559 Vitamin D deficiency, unspecified: Secondary | ICD-10-CM

## 2024-02-23 NOTE — Progress Notes (Signed)
 Endocrinology Follow Up Note       02/23/2024, 11:40 AM   Subjective:    Patient ID: Shawn Fox, male    DOB: 20-Jun-1955.  Shawn Fox is being seen in follow up after being seen in consultation for management of currently uncontrolled symptomatic diabetes requested by  Marchelle Clem Pitts, MD.   Past Medical History:  Diagnosis Date   Anxiety    Arthritis    Asthma    Barrett's esophagus    Bronchitis, simple, chronic (HCC)    has after flu or colds.  Had flu 1 month ago.   Carpal tunnel syndrome    Complication of anesthesia    vomiting   Cough    recent-   Depression    Diabetes mellitus without complication (HCC)    DJD (degenerative joint disease)    Fatty liver    GERD (gastroesophageal reflux disease)    Hyperlipidemia    Hypertension    IBS (irritable bowel syndrome)    Kidney stone    Pneumonia    had viral pneumonia 11/12-better   PONV (postoperative nausea and vomiting)    Sleep apnea    wears cpap    Past Surgical History:  Procedure Laterality Date   CARPAL TUNNEL RELEASE Bilateral    both   CHOLECYSTECTOMY     COLONOSCOPY  05/13/2016   Diverticulosis without bleeding pancolonic diverticulosis more prominent in sigmoid colon internal hemorrhoids second degree   CYSTOSCOPY W/ STONE MANIPULATION     ESOPHAGOGASTRODUODENOSCOPY  04/27/2005   normal esophagus moderate gastritis mainly involing the antrum in form of streaky erythemav duodenum normal   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   KNEE ARTHROSCOPY     left 3 x   KNEE ARTHROSCOPY     rt x2   MASS EXCISION  04/27/2011   Procedure: EXCISION MASS;  Surgeon: Dempsey JINNY Sensor;  Location: [facility];  Service: Orthopedics;  Laterality: Right;  RT 4TH FINGER GANGLION CYST EXCISION   SHOULDER SURGERY Left 2012   bone spur removed   UPPER GASTROINTESTINAL ENDOSCOPY      Social History    Socioeconomic History   Marital status: Married    Spouse name: Not on file   Number of children: 2   Years of education: Not on file   Highest education level: Not on file  Occupational History   Not on file  Tobacco Use   Smoking status: Never   Smokeless tobacco: Never  Vaping Use   Vaping status: Never Used  Substance and Sexual Activity   Alcohol use: Not Currently    Comment: last one 3 years    Drug use: No   Sexual activity: Not on file  Other Topics Concern   Not on file  Social History Narrative   Not on file   Social Drivers of Health   Financial Resource Strain: Not on file  Food Insecurity: Not on file  Transportation Needs: Not on file  Physical Activity: Not on file  Stress: Not on file  Social Connections: Not on file    Family History  Problem Relation Age of Onset   Colon cancer Father  Rectal cancer Father    Colon polyps Father    Stomach cancer Father    Esophageal cancer Paternal Grandfather    Other Daughter        tumor removed from stomach    Outpatient Encounter Medications as of 02/23/2024  Medication Sig   Continuous Glucose Sensor (DEXCOM G7 SENSOR) MISC Inject 1 Application into the skin as directed. Change sensor every 10 days as directed.   cyanocobalamin 100 MCG tablet Take by mouth.   divalproex (DEPAKOTE) 500 MG DR tablet Take 500 mg by mouth 2 (two) times daily.   ezetimibe (ZETIA) 10 MG tablet Take 10 mg by mouth daily.   insulin degludec  (TRESIBA  FLEXTOUCH) 200 UNIT/ML FlexTouch Pen Inject 80 Units into the skin at bedtime. (Patient taking differently: Inject 60 Units into the skin at bedtime.)   meloxicam (MOBIC) 15 MG tablet Take 15 mg by mouth daily. As needed.   olmesartan (BENICAR) 20 MG tablet Take 20 mg by mouth daily.   pantoprazole  (PROTONIX ) 40 MG tablet TAKE 1 TABLET(40 MG) BY MOUTH IN THE MORNING   rosuvastatin (CRESTOR) 10 MG tablet Take by mouth.   Semaglutide (OZEMPIC, 0.25 OR 0.5 MG/DOSE, [place]) Inject 0.5  mg into the skin once a week.   venlafaxine XR (EFFEXOR-XR) 150 MG 24 hr capsule Take 150 mg by mouth daily.   VITAMIN D, CHOLECALCIFEROL, PO Take 1 tablet by mouth daily.   zolpidem (AMBIEN) 10 MG tablet Take 10 mg by mouth at bedtime as needed.   [DISCONTINUED] tamsulosin (FLOMAX) 0.4 MG CAPS capsule Take 0.4 mg by mouth daily. (Patient not taking: Reported on 10/21/2023)   No facility-administered encounter medications on file as of 02/23/2024.    ALLERGIES: Allergies  Allergen Reactions   Dapagliflozin Other (See Comments)    Yeast infection    Demerol Nausea And Vomiting   Lisinopril Cough   Metformin Diarrhea    VACCINATION STATUS: Immunization History  Administered Date(s) Administered   Moderna Sars-Covid-2 Vaccination 08/05/2019, 08/07/2019, 09/05/2019, 09/08/2019, 03/27/2020    Diabetes He presents for his follow-up diabetic visit. He has type 2 diabetes mellitus. Onset time: Diagnosed at approx age of 81. His disease course has been stable. There are no hypoglycemic associated symptoms. There are no diabetic associated symptoms. There are no hypoglycemic complications. Symptoms are stable. There are no diabetic complications. Risk factors for coronary artery disease include diabetes mellitus, male sex, obesity and hypertension. Current diabetic treatment includes insulin injections (and Ozempic). He is compliant with treatment most of the time. His weight is decreasing steadily. He is following a generally unhealthy diet. When asked about meal planning, he reported none. He has not had a previous visit with a dietitian. He participates in exercise daily. His home blood glucose trend is decreasing steadily. His overall blood glucose range is 110-130 mg/dl. (He presents today with his CGM showing at target glycemic profile overall.  His most recent A1c, checked on 8/28 by his PCP was 6.4%, increasing slightly from last A1c of 6%.  He notes the Ozempic has helped him with portion  control but feels the effects are tapering off.  Analysis of his CGM shows TIR 87%, TAR 12%, TBR 1% with a GMI of 6.3%.) An ACE inhibitor/angiotensin II receptor blocker is being taken. He sees a podiatrist.Eye exam is current.    Review of systems  Constitutional: + decreasing body weight,  current Body mass index is 37.36 kg/m. , no fatigue, no subjective hyperthermia, no subjective hypothermia Eyes: no blurry  vision, no xerophthalmia ENT: no sore throat, no nodules palpated in throat, no dysphagia/odynophagia, no hoarseness Cardiovascular: no chest pain, no shortness of breath, no palpitations, no leg swelling Respiratory: no cough, no shortness of breath Gastrointestinal: no nausea/vomiting/diarrhea Musculoskeletal: no muscle/joint aches Skin: no rashes, no hyperemia Neurological: no tremors, no numbness, no tingling, no dizziness Psychiatric: no depression, no anxiety  Objective:     BP 112/80 (BP Location: Left Arm, Patient Position: Sitting, Cuff Size: Large)   Pulse 72   Ht 5' 9 (1.753 m)   Wt 253 lb (114.8 kg)   BMI 37.36 kg/m   Wt Readings from Last 3 Encounters:  02/23/24 253 lb (114.8 kg)  10/21/23 257 lb 6.4 oz (116.8 kg)  07/21/23 266 lb 9.6 oz (120.9 kg)     BP Readings from Last 3 Encounters:  02/23/24 112/80  10/21/23 128/74  07/21/23 130/68      Physical Exam- Limited  Constitutional:  Body mass index is 37.36 kg/m. , not in acute distress, normal state of mind Eyes:  EOMI, no exophthalmos Musculoskeletal: no gross deformities, strength intact in all four extremities, no gross restriction of joint movements Skin:  no rashes, no hyperemia Neurological: no tremor with outstretched hands   Diabetic Foot Exam - Simple   No data filed      CMP ( most recent) CMP     Component Value Date/Time   NA 136 08/26/2010 0410   K 4.6 08/26/2010 0410   CL 101 08/26/2010 0410   CO2 30 08/26/2010 0410   GLUCOSE 175 (H) 08/26/2010 0410   BUN 16  12/29/2022 0000   CREATININE 0.9 12/29/2022 0000   CREATININE 1.23 08/26/2010 0410   CALCIUM 8.2 (L) 08/26/2010 0410   EGFR 95 12/29/2022 0000   GFRNONAA >60 08/26/2010 0410     Diabetic Labs (most recent): Lab Results  Component Value Date   HGBA1C 6.0 (A) 10/21/2023   HGBA1C 8.4 (A) 07/21/2023   HGBA1C 9.0 (A) 04/12/2023     Lipid Panel ( most recent) Lipid Panel  No results found for: CHOL, TRIG, HDL, CHOLHDL, VLDL, LDLCALC, LDLDIRECT, LABVLDL    No results found for: TSH, FREET4         Assessment & Plan:   1) Type 2 diabetes mellitus without complication, with long-term current use of insulin (HCC)  He presents today with his CGM showing at target glycemic profile overall.  His most recent A1c, checked on 8/28 by his PCP was 6.4%, increasing slightly from last A1c of 6%.  He notes the Ozempic has helped him with portion control but feels the effects are tapering off.  Analysis of his CGM shows TIR 87%, TAR 12%, TBR 1% with a GMI of 6.3%.  - Shawn Fox has currently uncontrolled symptomatic type 2 DM since 68 years of age.   -Recent labs reviewed.  - I had a long discussion with him about the progressive nature of diabetes and the pathology behind its complications. -his diabetes is not currently complicated but he remains at a high risk for more acute and chronic complications which include CAD, CVA, CKD, retinopathy, and neuropathy. These are all discussed in detail with him.  The following Lifestyle Medicine recommendations according to American College of Lifestyle Medicine [facility]) were discussed and offered to patient and he agrees to start the journey:  A. Whole Foods, Plant-based plate comprising of fruits and vegetables, plant-based proteins, whole-grain carbohydrates was discussed in detail with the patient.   A list for  source of those nutrients were also provided to the patient.  Patient will use only water or unsweetened tea for  hydration. B.  The need to stay away from risky substances including alcohol, smoking; obtaining 7 to 9 hours of restorative sleep, at least 150 minutes of moderate intensity exercise weekly, the importance of healthy social connections,  and stress reduction techniques were discussed. C.  A full color page of  Calorie density of various food groups per pound showing examples of each food groups was provided to the patient.  - Nutritional counseling repeated at each appointment due to patients tendency to fall back in to old habits.  - The patient admits there is a room for improvement in their diet and drink choices. -  Suggestion is made for the patient to avoid simple carbohydrates from their diet including Cakes, Sweet Desserts / Pastries, Ice Cream, Soda (diet and regular), Sweet Tea, Candies, Chips, Cookies, Sweet Pastries, Store Bought Juices, Alcohol in Excess of 1-2 drinks a day, Artificial Sweeteners, Coffee Creamer, and Sugar-free Products. This will help patient to have stable blood glucose profile and potentially avoid unintended weight gain.   - I encouraged the patient to switch to unprocessed or minimally processed complex starch and increased protein intake (animal or plant source), fruits, and vegetables.   - Patient is advised to stick to a routine mealtimes to eat 3 meals a day and avoid unnecessary snacks (to snack only to correct hypoglycemia).  - I have approached him with the following individualized plan to manage his diabetes and patient agrees:   -He is advised to lower his Tresiba  to 50 units SQ nightly and will increase Ozempic to 1 mg SQ weekly.  May need to reduce the Tresiba  dose further after starting the 1 mg dose of Ozempic.  -he is encouraged to start/continue monitoring glucose 2 times daily (using CGM), before breakfast and before bed, and to call the clinic if he had readings less than 70 or above 300 for 3 tests in a row.    - he is warned not to take  insulin without proper monitoring per orders. - Adjustment parameters are given to him for hypo and hyperglycemia in writing.  - Specific targets for  A1c; LDL, HDL, and Triglycerides were discussed with the patient.  2) Blood Pressure /Hypertension:  his blood pressure is controlled to target.   he is advised to continue his current medications as prescribed by PCP.  3) Lipids/Hyperlipidemia:    Recent lipid panel from 01/13/24 shows controlled LDL of 45.  he is advised to continue Crestor 20 mg daily at bedtime.  Side effects and precautions discussed with him.  4)  Weight/Diet:  his Body mass index is 37.36 kg/m.  -  clearly complicating his diabetes care.   he is a candidate for weight loss. I discussed with him the fact that loss of 5 - 10% of his  current body weight will have the most impact on his diabetes management.  Exercise, and detailed carbohydrates information provided  -  detailed on discharge instructions.  5) Chronic Care/Health Maintenance: -he is on ACEI/ARB and Statin medications and is encouraged to initiate and continue to follow up with Ophthalmology, Dentist, Podiatrist at least yearly or according to recommendations, and advised to stay away from smoking. I have recommended yearly flu vaccine and pneumonia vaccine at least every 5 years; moderate intensity exercise for up to 150 minutes weekly; and sleep for at least 7 hours a day.  -  he is advised to maintain close follow up with Marchelle Clem Pitts, MD for primary care needs, as well as his other providers for optimal and coordinated care.     I spent  23  minutes in the care of the patient today including review of labs from CMP, Lipids, Thyroid Function, Hematology (current and previous including abstractions from other facilities); face-to-face time discussing  his blood glucose readings/logs, discussing hypoglycemia and hyperglycemia episodes and symptoms, medications doses, his options of short and long term  treatment based on the latest standards of care / guidelines;  discussion about incorporating lifestyle medicine;  and documenting the encounter. Risk reduction counseling performed per USPSTF guidelines to reduce obesity and cardiovascular risk factors.     Please refer to Patient Instructions for Blood Glucose Monitoring and Insulin/Medications Dosing Guide  in media tab for additional information. Please  also refer to  Patient Self Inventory in the Media  tab for reviewed elements of pertinent patient history.  Shawn Fox participated in the discussions, expressed understanding, and voiced agreement with the above plans.  All questions were answered to his satisfaction. he is encouraged to contact clinic should he have any questions or concerns prior to his return visit.     Follow up plan: - Return in about 3 months (around 05/25/2024) for Diabetes F/U with A1c in office, No previsit labs, Fox meter and logs.  Benton Rio, [facility] [address] [place], [place] [address] Phone: [phone] Fax: [phone]  02/23/2024, 11:40 AM

## 2024-02-23 NOTE — Telephone Encounter (Signed)
 Pt picked up PAP

## 2024-03-09 ENCOUNTER — Telehealth: Payer: Self-pay | Admitting: Nurse Practitioner

## 2024-03-09 NOTE — Telephone Encounter (Signed)
 Called and let pt know that PAP of ozempic came in.

## 2024-03-13 NOTE — Telephone Encounter (Signed)
 Pt picked up PAP of ozempic 

## 2024-04-21 LAB — LAB REPORT - SCANNED
A1c: 6.6
EGFR: 92

## 2024-05-31 ENCOUNTER — Ambulatory Visit: Admitting: Nurse Practitioner

## 2024-05-31 ENCOUNTER — Encounter: Payer: Self-pay | Admitting: Nurse Practitioner

## 2024-05-31 VITALS — BP 100/74 | HR 66 | Ht 69.0 in | Wt 254.8 lb

## 2024-05-31 DIAGNOSIS — I10 Essential (primary) hypertension: Secondary | ICD-10-CM | POA: Diagnosis not present

## 2024-05-31 DIAGNOSIS — E559 Vitamin D deficiency, unspecified: Secondary | ICD-10-CM | POA: Diagnosis not present

## 2024-05-31 DIAGNOSIS — Z794 Long term (current) use of insulin: Secondary | ICD-10-CM | POA: Diagnosis not present

## 2024-05-31 DIAGNOSIS — E782 Mixed hyperlipidemia: Secondary | ICD-10-CM | POA: Diagnosis not present

## 2024-05-31 DIAGNOSIS — E119 Type 2 diabetes mellitus without complications: Secondary | ICD-10-CM

## 2024-05-31 MED ORDER — TRESIBA FLEXTOUCH 200 UNIT/ML ~~LOC~~ SOPN: 60.0000 [IU] | PEN_INJECTOR | SUBCUTANEOUS | Status: AC

## 2024-05-31 NOTE — Progress Notes (Signed)
 "                                                                        Endocrinology Follow Up Note       05/31/2024, 12:11 PM   Subjective:    Patient ID: Shawn Fox, male    DOB: Feb 24, 1956.  Shawn Fox is being seen in follow up after being seen in consultation for management of currently uncontrolled symptomatic diabetes requested by  Marchelle Clem Pitts, MD.   Past Medical History:  Diagnosis Date   Anxiety    Arthritis    Asthma    Barrett's esophagus    Bronchitis, simple, chronic (HCC)    has after flu or colds.  Had flu 1 month ago.   Carpal tunnel syndrome    Complication of anesthesia    vomiting   Cough    recent-   Depression    Diabetes mellitus without complication (HCC)    DJD (degenerative joint disease)    Fatty liver    GERD (gastroesophageal reflux disease)    Hyperlipidemia    Hypertension    IBS (irritable bowel syndrome)    Kidney stone    Pneumonia    had viral pneumonia 11/12-better   PONV (postoperative nausea and vomiting)    Sleep apnea    wears cpap    Past Surgical History:  Procedure Laterality Date   CARPAL TUNNEL RELEASE Bilateral    both   CHOLECYSTECTOMY     COLONOSCOPY  05/13/2016   Diverticulosis without bleeding pancolonic diverticulosis more prominent in sigmoid colon internal hemorrhoids second degree   CYSTOSCOPY W/ STONE MANIPULATION     ESOPHAGOGASTRODUODENOSCOPY  04/27/2005   normal esophagus moderate gastritis mainly involing the antrum in form of streaky erythemav duodenum normal   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   KNEE ARTHROSCOPY     left 3 x   KNEE ARTHROSCOPY     rt x2   MASS EXCISION  04/27/2011   Procedure: EXCISION MASS;  Surgeon: Dempsey JINNY Sensor;  Location: [facility];  Service: Orthopedics;  Laterality: Right;  RT 4TH FINGER GANGLION CYST EXCISION   SHOULDER SURGERY Left 2012   bone spur removed   UPPER GASTROINTESTINAL ENDOSCOPY      Social History    Socioeconomic History   Marital status: Married    Spouse name: Not on file   Number of children: 2   Years of education: Not on file   Highest education level: Not on file  Occupational History   Not on file  Tobacco Use   Smoking status: Never   Smokeless tobacco: Never  Vaping Use   Vaping status: Never Used  Substance and Sexual Activity   Alcohol use: Not Currently    Comment: last one 3 years    Drug use: No   Sexual activity: Not on file  Other Topics Concern   Not on file  Social History Narrative   Not on file   Social Drivers of Health   Tobacco Use: Low Risk (05/31/2024)   Patient History    Smoking Tobacco Use: Never    Smokeless Tobacco Use: Never    Passive Exposure: Not on file  Financial Resource Strain: Not on file  Food Insecurity: Not on file  Transportation Needs: Not on file  Physical Activity: Not on file  Stress: Not on file  Social Connections: Not on file  Depression (EYV7-0): Not on file  Alcohol Screen: Not on file  Housing: Not on file  Utilities: Not on file  Health Literacy: Not on file    Family History  Problem Relation Age of Onset   Colon cancer Father    Rectal cancer Father    Colon polyps Father    Stomach cancer Father    Esophageal cancer Paternal Grandfather    Other Daughter        tumor removed from stomach    Outpatient Encounter Medications as of 05/31/2024  Medication Sig   Continuous Glucose Sensor (DEXCOM G7 SENSOR) MISC Inject 1 Application into the skin as directed. Change sensor every 10 days as directed.   cyanocobalamin 100 MCG tablet Take by mouth.   divalproex (DEPAKOTE) 500 MG DR tablet Take 500 mg by mouth 2 (two) times daily.   ezetimibe (ZETIA) 10 MG tablet Take 10 mg by mouth daily.   meloxicam (MOBIC) 15 MG tablet Take 15 mg by mouth daily. As needed.   olmesartan (BENICAR) 20 MG tablet Take 20 mg by mouth daily.   pantoprazole  (PROTONIX ) 40 MG tablet TAKE 1 TABLET(40 MG) BY MOUTH IN THE  MORNING   rosuvastatin (CRESTOR) 10 MG tablet Take by mouth.   venlafaxine XR (EFFEXOR-XR) 150 MG 24 hr capsule Take 150 mg by mouth daily.   VITAMIN D, CHOLECALCIFEROL, PO Take 1 tablet by mouth daily.   zolpidem (AMBIEN) 10 MG tablet Take 10 mg by mouth at bedtime as needed.   [DISCONTINUED] insulin degludec  (TRESIBA  FLEXTOUCH) 200 UNIT/ML FlexTouch Pen Inject 80 Units into the skin at bedtime. (Patient taking differently: Inject 50 Units into the skin at bedtime.)   insulin degludec  (TRESIBA  FLEXTOUCH) 200 UNIT/ML FlexTouch Pen Inject 60 Units into the skin daily.   [DISCONTINUED] Semaglutide (OZEMPIC, 0.25 OR 0.5 MG/DOSE, Dale) Inject 0.5 mg into the skin once a week. (Patient not taking: Reported on 05/31/2024)   No facility-administered encounter medications on file as of 05/31/2024.    ALLERGIES: Allergies  Allergen Reactions   Dapagliflozin Other (See Comments)    Yeast infection    Demerol Nausea And Vomiting   Lisinopril Cough   Metformin Diarrhea    VACCINATION STATUS: Immunization History  Administered Date(s) Administered   Moderna Sars-Covid-2 Vaccination 08/05/2019, 08/07/2019, 09/05/2019, 09/08/2019, 03/27/2020    Diabetes He presents for his follow-up diabetic visit. He has type 2 diabetes mellitus. Onset time: Diagnosed at approx age of 69. His disease course has been stable. There are no hypoglycemic associated symptoms. There are no diabetic associated symptoms. There are no hypoglycemic complications. Symptoms are stable. There are no diabetic complications. Risk factors for coronary artery disease include diabetes mellitus, male sex, obesity and hypertension. Current diabetic treatment includes insulin injections. He is compliant with treatment most of the time. His weight is fluctuating minimally. He is following a generally unhealthy diet. When asked about meal planning, he reported none. He has not had a previous visit with a dietitian. He participates in exercise  daily. His home blood glucose trend is fluctuating minimally. His overall blood glucose range is 140-180 mg/dl. (He presents today with his CGM showing at target glycemic profile overall.  His most recent A1c, checked on 12/4 was 6.6%, increasing slightly from last A1c of 6.4%.  He  is no longer on the Ozempic (was on patient assistance for this but due to changes in plan unable to get this from them any longer- and not able to afford the monthly copay at pharmacy).   Analysis of his CGM shows TIR 73%, TAR 27%, TBR 0% with a GMI of 6.9%.  He notes he did increase his Tresiba  to 60 units nightly to help control readings off the Ozempic.) An ACE inhibitor/angiotensin II receptor blocker is being taken. He sees a podiatrist.Eye exam is current.    Review of systems  Constitutional: + Minimally fluctuating body weight,  current Body mass index is 37.63 kg/m. , no fatigue, no subjective hyperthermia, no subjective hypothermia Eyes: no blurry vision, no xerophthalmia ENT: no sore throat, no nodules palpated in throat, no dysphagia/odynophagia, no hoarseness Cardiovascular: no chest pain, no shortness of breath, no palpitations, no leg swelling Respiratory: no cough, no shortness of breath Gastrointestinal: no nausea/vomiting/diarrhea Musculoskeletal: no muscle/joint aches Skin: no rashes, no hyperemia Neurological: no tremors, no numbness, no tingling, no dizziness Psychiatric: no depression, no anxiety  Objective:     BP 100/74 (BP Location: Left Arm, Patient Position: Sitting, Cuff Size: Large)   Pulse 66   Ht 5' 9 (1.753 m)   Wt 254 lb 12.8 oz (115.6 kg)   BMI 37.63 kg/m   Wt Readings from Last 3 Encounters:  05/31/24 254 lb 12.8 oz (115.6 kg)  02/23/24 253 lb (114.8 kg)  10/21/23 257 lb 6.4 oz (116.8 kg)     BP Readings from Last 3 Encounters:  05/31/24 100/74  02/23/24 112/80  10/21/23 128/74      Physical Exam- Limited  Constitutional:  Body mass index is 37.63 kg/m. , not  in acute distress, normal state of mind Eyes:  EOMI, no exophthalmos Musculoskeletal: no gross deformities, strength intact in all four extremities, no gross restriction of joint movements Skin:  no rashes, no hyperemia Neurological: no tremor with outstretched hands   Diabetic Foot Exam - Simple   No data filed      CMP ( most recent) CMP     Component Value Date/Time   NA 136 08/26/2010 0410   K 4.6 08/26/2010 0410   CL 101 08/26/2010 0410   CO2 30 08/26/2010 0410   GLUCOSE 175 (H) 08/26/2010 0410   BUN 16 12/29/2022 0000   CREATININE 0.9 12/29/2022 0000   CREATININE 1.23 08/26/2010 0410   CALCIUM 8.2 (L) 08/26/2010 0410   EGFR 92.0 04/21/2024 1254   EGFR 95 12/29/2022 0000   GFRNONAA >60 08/26/2010 0410     Diabetic Labs (most recent): Lab Results  Component Value Date   HGBA1C 6.0 (A) 10/21/2023   HGBA1C 8.4 (A) 07/21/2023   HGBA1C 9.0 (A) 04/12/2023     Lipid Panel ( most recent) Lipid Panel  No results found for: CHOL, TRIG, HDL, CHOLHDL, VLDL, LDLCALC, LDLDIRECT, LABVLDL    No results found for: TSH, FREET4         Assessment & Plan:   1) Type 2 diabetes mellitus without complication, with long-term current use of insulin (HCC)  He presents today with his CGM showing at target glycemic profile overall.  His most recent A1c, checked on 12/4 was 6.6%, increasing slightly from last A1c of 6.4%.  He is no longer on the Ozempic (was on patient assistance for this but due to changes in plan unable to get this from them any longer- and not able to afford the monthly copay at pharmacy).  Analysis of his CGM shows TIR 73%, TAR 27%, TBR 0% with a GMI of 6.9%.  He notes he did increase his Tresiba  to 60 units nightly to help control readings off the Ozempic.  - Jimi Schappert has currently uncontrolled symptomatic type 2 DM since 69 years of age.   -Recent labs reviewed.  - I had a long discussion with him about the progressive nature of  diabetes and the pathology behind its complications. -his diabetes is not currently complicated but he remains at a high risk for more acute and chronic complications which include CAD, CVA, CKD, retinopathy, and neuropathy. These are all discussed in detail with him.  The following Lifestyle Medicine recommendations according to American College of Lifestyle Medicine [facility]) were discussed and offered to patient and he agrees to start the journey:  A. Whole Foods, Plant-based plate comprising of fruits and vegetables, plant-based proteins, whole-grain carbohydrates was discussed in detail with the patient.   A list for source of those nutrients were also provided to the patient.  Patient will use only water or unsweetened tea for hydration. B.  The need to stay away from risky substances including alcohol, smoking; obtaining 7 to 9 hours of restorative sleep, at least 150 minutes of moderate intensity exercise weekly, the importance of healthy social connections,  and stress reduction techniques were discussed. C.  A full color page of  Calorie density of various food groups per pound showing examples of each food groups was provided to the patient.  - Nutritional counseling repeated/built upon at each appointment.  - The patient admits there is a room for improvement in their diet and drink choices. -  Suggestion is made for the patient to avoid simple carbohydrates from their diet including Cakes, Sweet Desserts / Pastries, Ice Cream, Soda (diet and regular), Sweet Tea, Candies, Chips, Cookies, Sweet Pastries, Store Bought Juices, Alcohol in Excess of 1-2 drinks a day, Artificial Sweeteners, Coffee Creamer, and Sugar-free Products. This will help patient to have stable blood glucose profile and potentially avoid unintended weight gain.   - I encouraged the patient to switch to unprocessed or minimally processed complex starch and increased protein intake (animal or plant source), fruits, and  vegetables.   - Patient is advised to stick to a routine mealtimes to eat 3 meals a day and avoid unnecessary snacks (to snack only to correct hypoglycemia).  - I have approached him with the following individualized plan to manage his diabetes and patient agrees:   -He is advised to continue Tresiba  60 units SQ nightly.  He is no longer able to afford Ozempic (although he tolerated this medication well).  -he is encouraged to start/continue monitoring glucose 2 times daily (using CGM), before breakfast and before bed, and to call the clinic if he had readings less than 70 or above 300 for 3 tests in a row.    - he is warned not to take insulin without proper monitoring per orders. - Adjustment parameters are given to him for hypo and hyperglycemia in writing.  - Specific targets for  A1c; LDL, HDL, and Triglycerides were discussed with the patient.  2) Blood Pressure /Hypertension:  his blood pressure is controlled to target.   he is advised to continue his current medications as prescribed by PCP.  3) Lipids/Hyperlipidemia:    Recent lipid panel from 04/20/24 shows controlled LDL of 38.  he is advised to continue Crestor 20 mg daily at bedtime.  Side effects and precautions discussed with him.  4)  Weight/Diet:  his Body mass index is 37.63 kg/m.  -  clearly complicating his diabetes care.   he is a candidate for weight loss. I discussed with him the fact that loss of 5 - 10% of his  current body weight will have the most impact on his diabetes management.  Exercise, and detailed carbohydrates information provided  -  detailed on discharge instructions.  5) Chronic Care/Health Maintenance: -he is on ACEI/ARB and Statin medications and is encouraged to initiate and continue to follow up with Ophthalmology, Dentist, Podiatrist at least yearly or according to recommendations, and advised to stay away from smoking. I have recommended yearly flu vaccine and pneumonia vaccine at least every 5  years; moderate intensity exercise for up to 150 minutes weekly; and sleep for at least 7 hours a day.  - he is advised to maintain close follow up with Marchelle Clem Pitts, MD for primary care needs, as well as his other providers for optimal and coordinated care.     I spent  42  minutes in the care of the patient today including review of labs from CMP, Lipids, Thyroid Function, Hematology (current and previous including abstractions from other facilities); face-to-face time discussing  his blood glucose readings/logs, discussing hypoglycemia and hyperglycemia episodes and symptoms, medications doses, his options of short and long term treatment based on the latest standards of care / guidelines;  discussion about incorporating lifestyle medicine;  and documenting the encounter. Risk reduction counseling performed per USPSTF guidelines to reduce obesity and cardiovascular risk factors.     Please refer to Patient Instructions for Blood Glucose Monitoring and Insulin/Medications Dosing Guide  in media tab for additional information. Please  also refer to  Patient Self Inventory in the Media  tab for reviewed elements of pertinent patient history.  Marsa Bring participated in the discussions, expressed understanding, and voiced agreement with the above plans.  All questions were answered to his satisfaction. he is encouraged to contact clinic should he have any questions or concerns prior to his return visit.    Follow up plan: - Return in about 3 months (around 08/29/2024) for Diabetes F/U with A1c in office, No previsit labs, Bring meter and logs.  Benton Rio, [facility] [address] [place], [place] [address] Phone: [phone] Fax: [phone]  05/31/2024, 12:11 PM    "

## 2024-06-16 ENCOUNTER — Telehealth: Payer: Self-pay | Admitting: *Deleted

## 2024-06-16 NOTE — Telephone Encounter (Signed)
 Patient left a message that his insurance notified him that his Tresiba  will be changing to Lantus. Patient states that he is okay for now, that he will call office a few weeks or so prior to running out of the Tresiba .  He also shared that [facility] told him that they had sent multiply request for his sensors to us  and has not heard anything. Reviewing his medical record, on November 26,2025 this request was completed and faxed to this pharmacy.  Attempted to call them to followup on this, message says to call back on next business day. Today is Friday, we will try to reach out next week. Patient is aware.

## 2024-08-30 ENCOUNTER — Ambulatory Visit: Admitting: Nurse Practitioner
